# Patient Record
Sex: Female | Born: 1995 | Hispanic: No | Marital: Single | Smoking: Never smoker
Health system: Southern US, Community
[De-identification: ages and names within clinical notes are randomized; demographics above are authoritative.]

## PROBLEM LIST (undated history)

## (undated) DIAGNOSIS — N390 Urinary tract infection, site not specified: Secondary | ICD-10-CM

## (undated) DIAGNOSIS — R002 Palpitations: Secondary | ICD-10-CM

## (undated) DIAGNOSIS — G43109 Migraine with aura, not intractable, without status migrainosus: Secondary | ICD-10-CM

## (undated) HISTORY — DX: Urinary tract infection, site not specified: N39.0

## (undated) HISTORY — DX: Palpitations: R00.2

## (undated) HISTORY — DX: Migraine with aura, not intractable, without status migrainosus: G43.109

---
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
## 2016-04-15 ENCOUNTER — Encounter: Payer: Self-pay | Admitting: Pediatrics

## 2016-04-16 ENCOUNTER — Encounter: Payer: Self-pay | Admitting: Pediatrics

## 2018-06-02 ENCOUNTER — Other Ambulatory Visit (HOSPITAL_COMMUNITY): Payer: Self-pay

## 2018-06-02 DIAGNOSIS — Z3687 Encounter for antenatal screening for uncertain dates: Secondary | ICD-10-CM

## 2018-06-07 ENCOUNTER — Other Ambulatory Visit (HOSPITAL_COMMUNITY): Payer: Self-pay

## 2018-06-07 ENCOUNTER — Ambulatory Visit (HOSPITAL_COMMUNITY)
Admission: RE | Admit: 2018-06-07 | Discharge: 2018-06-07 | Disposition: A | Discharge status: Home / Self Care | Payer: Medicaid Other | Source: Ambulatory Visit

## 2018-06-07 DIAGNOSIS — Z3A12 12 weeks gestation of pregnancy: Secondary | ICD-10-CM | POA: Diagnosis not present

## 2018-06-07 DIAGNOSIS — Z3687 Encounter for antenatal screening for uncertain dates: Secondary | ICD-10-CM

## 2018-06-28 ENCOUNTER — Other Ambulatory Visit (HOSPITAL_COMMUNITY): Payer: Self-pay

## 2018-06-28 DIAGNOSIS — Z3689 Encounter for other specified antenatal screening: Secondary | ICD-10-CM

## 2018-06-28 DIAGNOSIS — Z3A19 19 weeks gestation of pregnancy: Secondary | ICD-10-CM

## 2018-06-28 DIAGNOSIS — O2612 Low weight gain in pregnancy, second trimester: Secondary | ICD-10-CM

## 2018-06-28 DIAGNOSIS — Z3402 Encounter for supervision of normal first pregnancy, second trimester: Secondary | ICD-10-CM

## 2018-07-26 ENCOUNTER — Encounter (HOSPITAL_COMMUNITY): Payer: Self-pay

## 2018-08-02 ENCOUNTER — Encounter (HOSPITAL_COMMUNITY): Payer: Self-pay | Admitting: *Deleted

## 2018-08-02 ENCOUNTER — Ambulatory Visit (HOSPITAL_COMMUNITY)
Admission: RE | Admit: 2018-08-02 | Discharge: 2018-08-02 | Disposition: A | Discharge status: Home / Self Care | Payer: Medicaid Other | Source: Ambulatory Visit

## 2018-08-02 DIAGNOSIS — Z3A20 20 weeks gestation of pregnancy: Secondary | ICD-10-CM | POA: Diagnosis not present

## 2018-08-02 DIAGNOSIS — Z3689 Encounter for other specified antenatal screening: Secondary | ICD-10-CM

## 2018-08-02 DIAGNOSIS — Z3402 Encounter for supervision of normal first pregnancy, second trimester: Secondary | ICD-10-CM

## 2018-08-02 DIAGNOSIS — O2612 Low weight gain in pregnancy, second trimester: Secondary | ICD-10-CM

## 2018-08-02 DIAGNOSIS — Z3A19 19 weeks gestation of pregnancy: Secondary | ICD-10-CM

## 2018-08-02 DIAGNOSIS — Z363 Encounter for antenatal screening for malformations: Secondary | ICD-10-CM | POA: Diagnosis present

## 2018-08-03 ENCOUNTER — Other Ambulatory Visit (HOSPITAL_COMMUNITY): Payer: Self-pay | Admitting: *Deleted

## 2018-08-03 DIAGNOSIS — Z362 Encounter for other antenatal screening follow-up: Secondary | ICD-10-CM

## 2018-08-31 ENCOUNTER — Ambulatory Visit (HOSPITAL_COMMUNITY)
Admission: RE | Admit: 2018-08-31 | Discharge: 2018-08-31 | Disposition: A | Discharge status: Home / Self Care | Payer: Medicaid Other | Source: Ambulatory Visit

## 2018-08-31 DIAGNOSIS — Z362 Encounter for other antenatal screening follow-up: Secondary | ICD-10-CM | POA: Diagnosis present

## 2018-08-31 DIAGNOSIS — Z3A24 24 weeks gestation of pregnancy: Secondary | ICD-10-CM | POA: Diagnosis not present

## 2018-12-20 ENCOUNTER — Other Ambulatory Visit (HOSPITAL_COMMUNITY): Payer: Self-pay

## 2018-12-20 DIAGNOSIS — Z3A41 41 weeks gestation of pregnancy: Principal | ICD-10-CM

## 2018-12-20 DIAGNOSIS — O480 Post-term pregnancy: Secondary | ICD-10-CM

## 2018-12-26 ENCOUNTER — Encounter (HOSPITAL_COMMUNITY): Payer: Self-pay

## 2018-12-26 ENCOUNTER — Ambulatory Visit (HOSPITAL_COMMUNITY): Admission: RE | Admit: 2018-12-26 | Payer: Medicaid Other | Source: Ambulatory Visit

## 2019-03-14 ENCOUNTER — Encounter (HOSPITAL_COMMUNITY): Payer: Self-pay

## 2019-12-13 IMAGING — US US MFM OB FOLLOW-UP
1 series · 14 of 28 positions shown · non-contrast
Comparison: none

[Series 1: us mfm ob follow-up · 45 acquisitions, 14 frames shown]
[im 2/45]
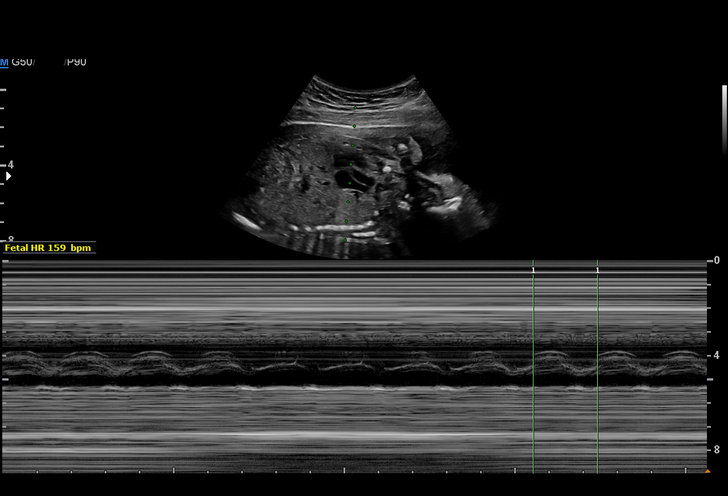
[im 5/45]
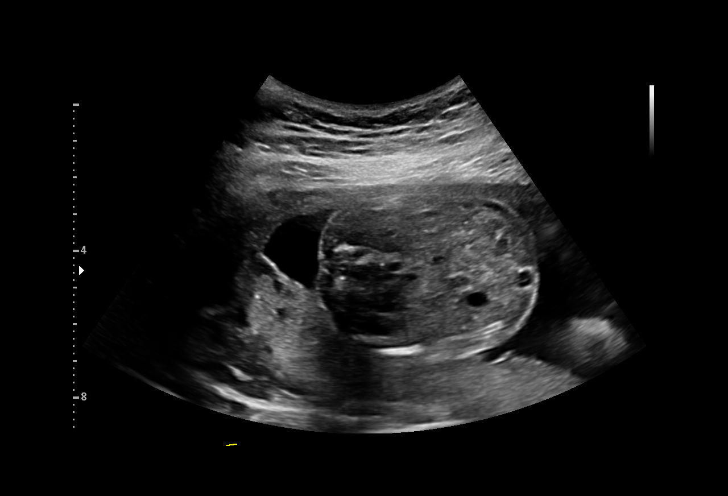
[im 9/45]
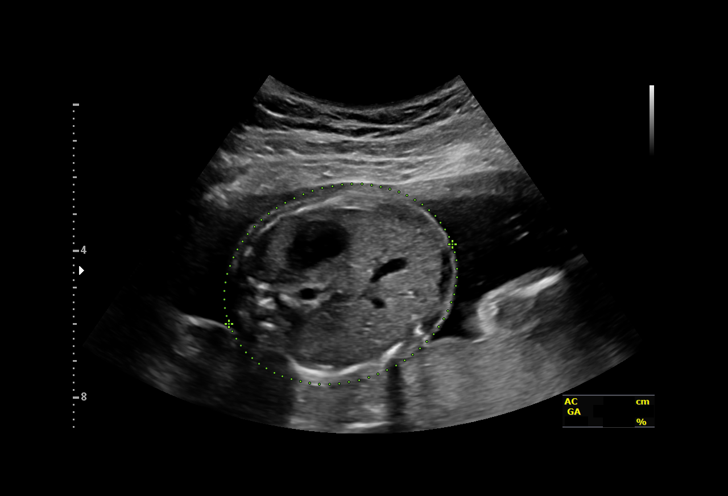
[im 12/45]
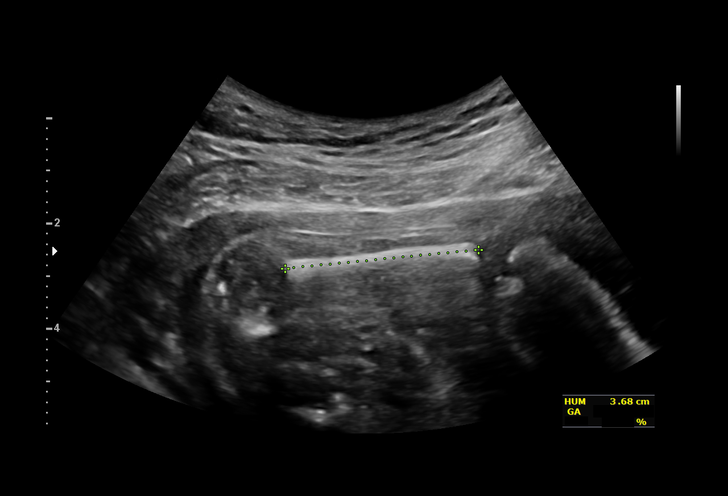
[im 15/45]
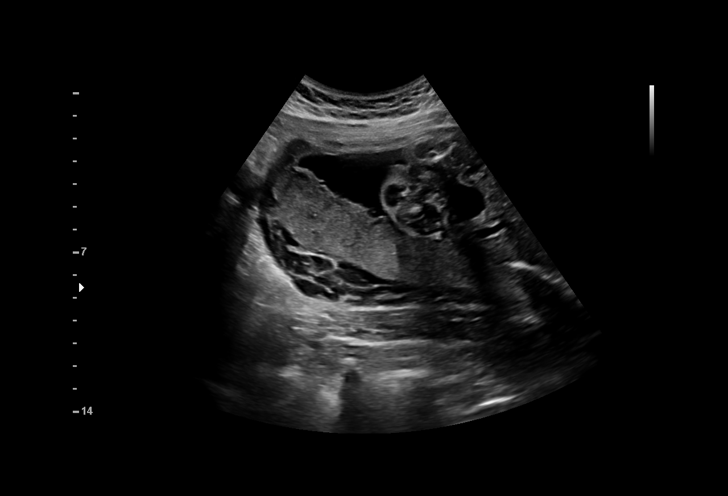
[im 18/45]
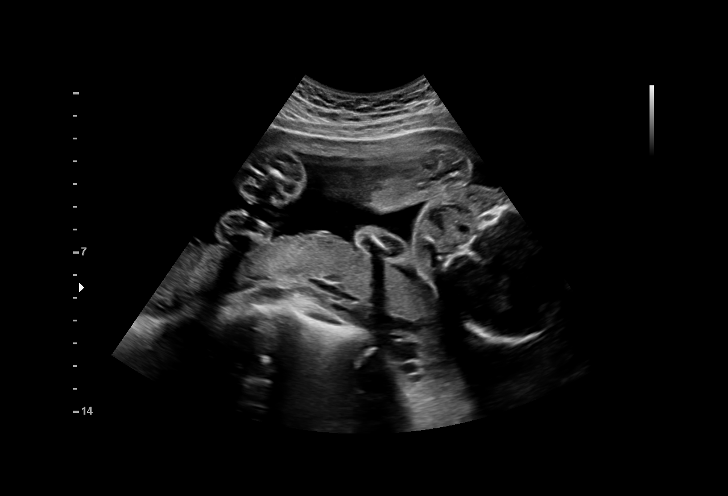
[im 22/45]
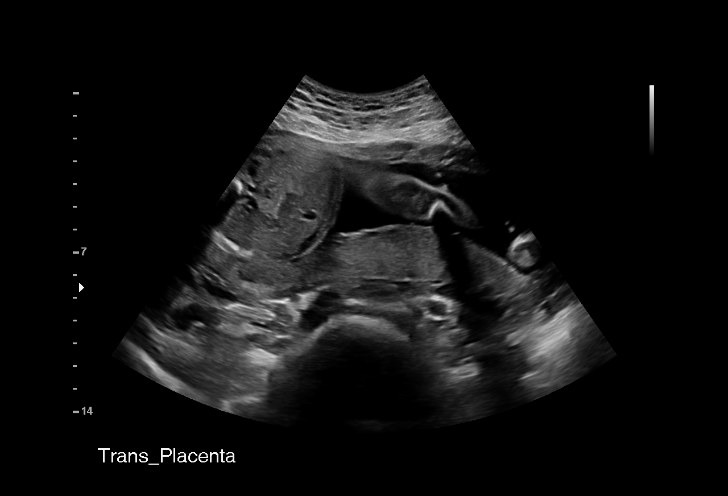
[im 25/45]
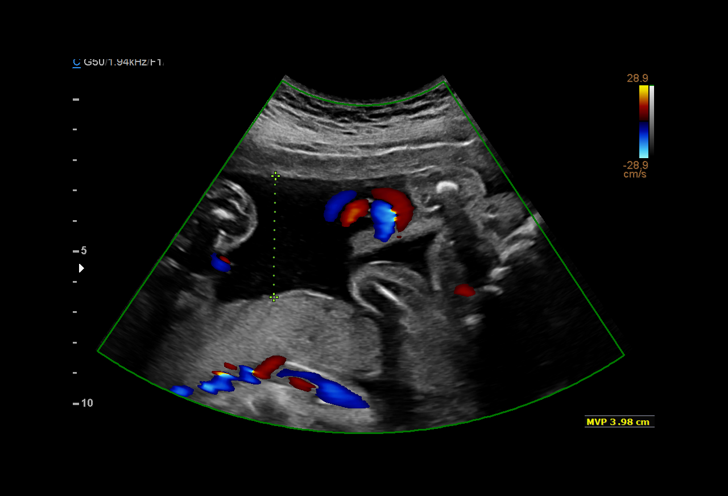
[im 28/45]
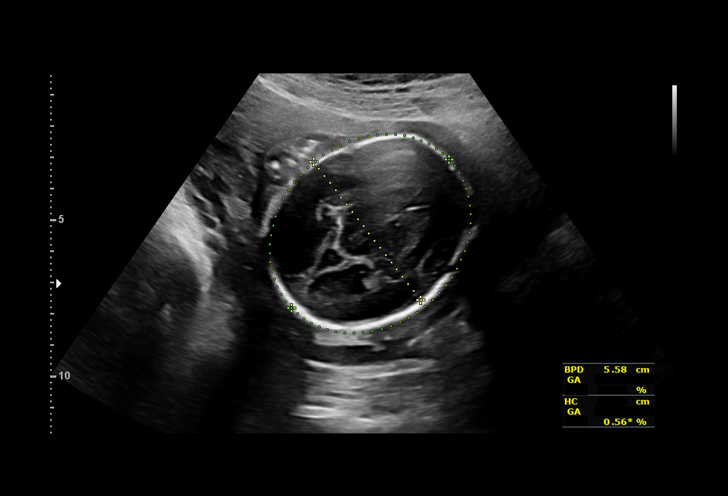
[im 31/45]
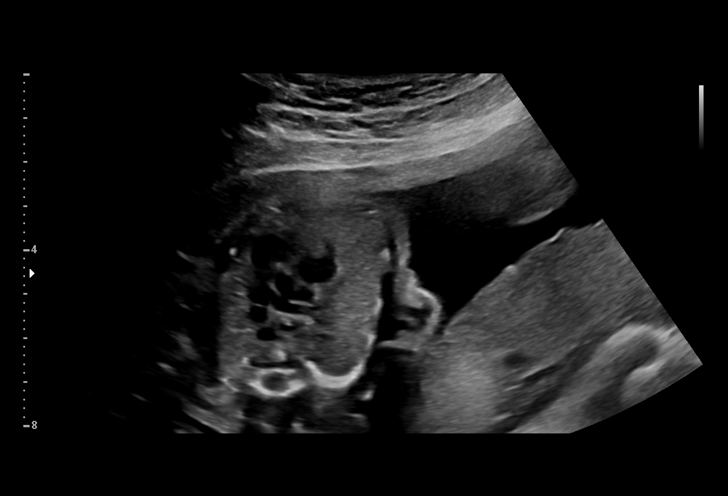
[im 35/45]
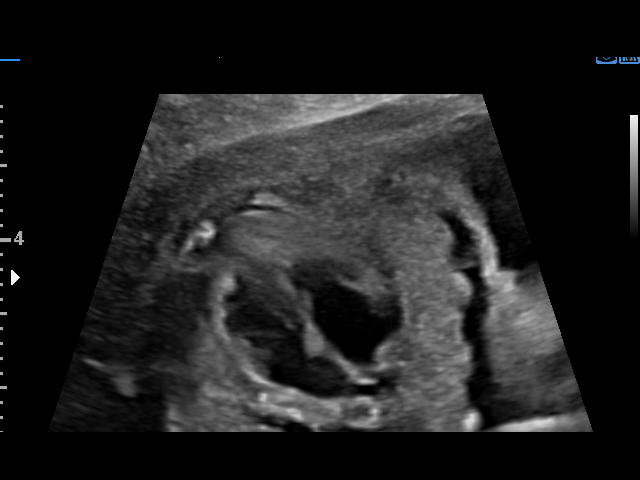
[im 38/45]
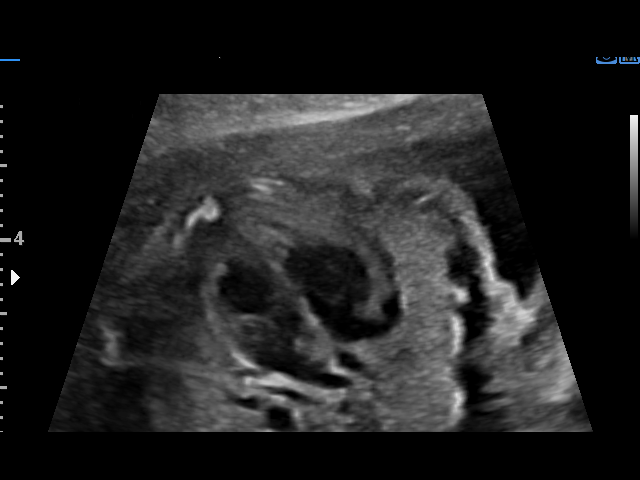
[im 41/45]
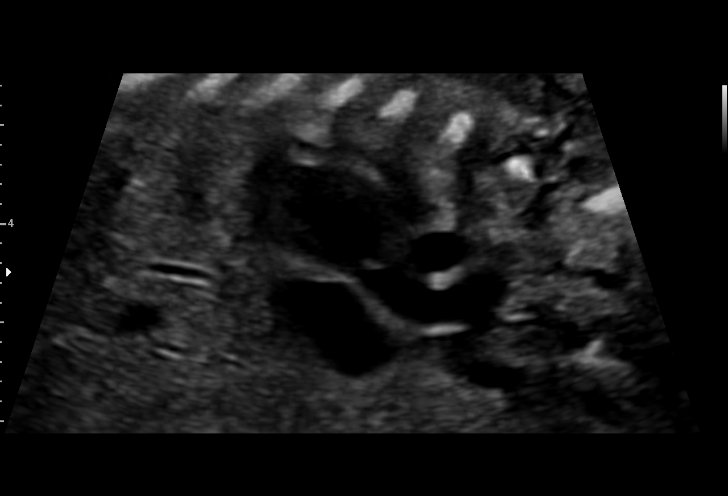
[im 45/45]
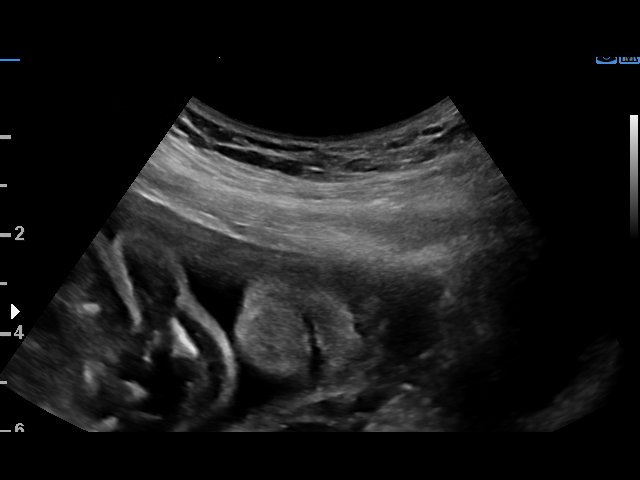

[14 of 28 positions shown; findings below may reference images not displayed]

CNM

                                                       MENTI
 ----------------------------------------------------------------------

 ----------------------------------------------------------------------
Indications

  Encounter for other antenatal screening
  follow-up
  24 weeks gestation of pregnancy
 ----------------------------------------------------------------------
Vital Signs

 Weight (lb): 161
Fetal Evaluation

 Num Of Fetuses:         1
 Fetal Heart Rate(bpm):  159
 Cardiac Activity:       Observed
 Presentation:           Cephalic
 Placenta:               Posterior
 P. Cord Insertion:      Previously Visualized

 Amniotic Fluid
 AFI FV:      Within normal limits

                             Largest Pocket(cm)

Biometry

 BPD:      55.8  mm     G. Age:  23w 0d         10  %    CI:        76.72   %    70 - 86
                                                         FL/HC:      20.1   %    18.7 -
 HC:      201.8  mm     G. Age:  22w 2d        < 3  %    HC/AC:      1.08        1.05 -
 AC:      186.1  mm     G. Age:  23w 3d         21  %    FL/BPD:     72.6   %    71 - 87
 FL:       40.5  mm     G. Age:  23w 1d         11  %    FL/AC:      21.8   %    20 - 24
 HUM:      36.5  mm     G. Age:  22w 5d          7  %
 LV:        6.9  mm
 Est. FW:     569  gm      1 lb 4 oz     31  %
OB History

 Gravidity:    1         Term:   0        Prem:   0        SAB:   0
 TOP:          0       Ectopic:  0        Living: 0
Gestational Age

 U/S Today:     23w 0d                                        EDD:   12/28/18
 Best:          24w 1d     Det. By:  Early Ultrasound         EDD:   12/20/18
                                     (06/07/18)
Anatomy

 Cranium:               Appears normal         Aortic Arch:            Previously seen
 Cavum:                 Previously seen        Ductal Arch:            Previously seen
 Ventricles:            Appears normal         Diaphragm:              Appears normal
 Choroid Plexus:        Previously seen        Stomach:                Appears normal, left
                                                                       sided
 Cerebellum:            Previously seen        Abdomen:                Appears normal
 Posterior Fossa:       Previously seen        Abdominal Wall:         Previously seen
 Nuchal Fold:           Previously seen        Cord Vessels:           Previously seen
 Face:                  Orbits and profile     Kidneys:                Appear normal
                        previously seen
 Lips:                  Appears normal         Bladder:                Appears normal
 Thoracic:              Appears normal         Spine:                  Previously seen
 Heart:                 Appears normal         Upper Extremities:      Previously seen
                        (4CH, axis, and situs
 RVOT:                  Appears normal         Lower Extremities:      Previously seen
 LVOT:                  Appears normal

 Other:  Heels and 5th digit prev visualized. Nasal bone prev visualized.
         Female gender previously seen.
Cervix Uterus Adnexa

 Cervix
 Length:           3.15  cm.
 Normal appearance by transabdominal scan.

 Uterus
 No abnormality visualized.

 Left Ovary
 Previously seen.

 Right Ovary
 Previously seen
Impression

 Live appropriately grown late 2nd trimester singleton
 gestation with normal amniotic fluid volume.
Recommendations

 Follow up as clinically indicated and or scheduled.
                Fassi, Crombez

## 2020-09-18 ENCOUNTER — Inpatient Hospital Stay (HOSPITAL_COMMUNITY)
Admission: EM | Admit: 2020-09-18 | Discharge: 2020-09-18 | Disposition: A | Discharge status: Home / Self Care | Payer: Medicaid Other | Attending: Family Medicine | Admitting: Family Medicine

## 2020-09-18 ENCOUNTER — Encounter (HOSPITAL_COMMUNITY): Payer: Self-pay | Admitting: *Deleted

## 2020-09-18 DIAGNOSIS — O26891 Other specified pregnancy related conditions, first trimester: Secondary | ICD-10-CM | POA: Insufficient documentation

## 2020-09-18 DIAGNOSIS — G43109 Migraine with aura, not intractable, without status migrainosus: Secondary | ICD-10-CM | POA: Diagnosis not present

## 2020-09-18 DIAGNOSIS — Z3A01 Less than 8 weeks gestation of pregnancy: Secondary | ICD-10-CM | POA: Diagnosis not present

## 2020-09-18 LAB — URINALYSIS, ROUTINE W REFLEX MICROSCOPIC
Bilirubin Urine: NEGATIVE
Glucose, UA: NEGATIVE mg/dL
Hgb urine dipstick: NEGATIVE
Ketones, ur: 5 mg/dL — AB
Leukocytes,Ua: NEGATIVE
Nitrite: NEGATIVE
Protein, ur: NEGATIVE mg/dL
Specific Gravity, Urine: 1.02 (ref 1.005–1.030)
pH: 5 (ref 5.0–8.0)

## 2020-09-18 LAB — CBC
HCT: 40.3 % (ref 36.0–46.0)
Hemoglobin: 13.7 g/dL (ref 12.0–15.0)
MCH: 31.5 pg (ref 26.0–34.0)
MCHC: 34 g/dL (ref 30.0–36.0)
MCV: 92.6 fL (ref 80.0–100.0)
Platelets: 241 10*3/uL (ref 150–400)
RBC: 4.35 MIL/uL (ref 3.87–5.11)
RDW: 12.3 % (ref 11.5–15.5)
WBC: 11.6 10*3/uL — ABNORMAL HIGH (ref 4.0–10.5)
nRBC: 0 % (ref 0.0–0.2)

## 2020-09-18 LAB — BASIC METABOLIC PANEL
Anion gap: 6 (ref 5–15)
BUN: 6 mg/dL (ref 6–20)
CO2: 23 mmol/L (ref 22–32)
Calcium: 8.7 mg/dL — ABNORMAL LOW (ref 8.9–10.3)
Chloride: 107 mmol/L (ref 98–111)
Creatinine, Ser: 0.53 mg/dL (ref 0.44–1.00)
GFR, Estimated: >60 mL/min (ref >60)
Glucose, Bld: 95 mg/dL (ref 70–99)
Potassium: 3.9 mmol/L (ref 3.5–5.1)
Sodium: 136 mmol/L (ref 135–145)

## 2020-09-18 LAB — HCG, QUANTITATIVE, PREGNANCY: hCG, Beta Chain, Quant, S: 97784 m[IU]/mL — ABNORMAL HIGH (ref <5)

## 2020-09-18 LAB — POC URINE PREG, ED: Preg Test, Ur: POSITIVE — AB

## 2020-09-18 NOTE — MAU Note (Signed)
Sent up from ER. With first preg,  "would lose her vision", parts of her body would go numb, would get like "aura's in her vision"  Continued off and on for a year.  No testing related to this.  Today it started again,  Had a +HPT, confirmed in the ED. Has a slight HA, has not taken anything (3 days).  Had high BP through out first preg.

## 2020-09-18 NOTE — ED Triage Notes (Signed)
Pt reports LMP end of October. Pt reports positive home pregnancy test. Denies prenatal care. Pt reports new onset of blurred vision and HTN but hasn't taken her blood pressure. Pt reports hx of elevated blood pressure with previous pregnancy. VSS at present.

## 2020-09-18 NOTE — MAU Provider Note (Signed)
Chief Complaint: Hypertension, Headache, and Possible Pregnancy   None     SUBJECTIVE HPI: Abigail Morrow is a 24 y.o. G2P1001 at [redacted]w[redacted]d who presents to maternity admissions reporting intermittent headache, vision changes, numbness of right hand.  She is not having a headache while in MAU.  Pt reports she had similar symptoms during her last pregnancy and the symptoms started again with this pregnancy. She will lose part of her visual field and have numbness in one arm/hand, which resolves and is followed by a headache that feels like pressure in the front and top of her head.  There are no other associated symptoms. She denies any abdominal pain or vaginal bleeding. She is sure of her LMP.     HPI  No past medical history on file. No past surgical history on file. Social History   Socioeconomic History  . Marital status: Single    Spouse name: Not on file  . Number of children: Not on file  . Years of education: Not on file  . Highest education level: Not on file  Occupational History  . Not on file  Tobacco Use  . Smoking status: Never Smoker  . Smokeless tobacco: Not on file  Substance and Sexual Activity  . Alcohol use: No  . Drug use: Not on file  . Sexual activity: Not on file  Other Topics Concern  . Not on file  Social History Narrative  . Not on file   Social Determinants of Health   Financial Resource Strain: Not on file  Food Insecurity: Not on file  Transportation Needs: Not on file  Physical Activity: Not on file  Stress: Not on file  Social Connections: Not on file  Intimate Partner Violence: Not on file   No current facility-administered medications on file prior to encounter.   Current Outpatient Medications on File Prior to Encounter  Medication Sig Dispense Refill  . amoxicillin (AMOXIL) 500 MG capsule Take 1 capsule (500 mg total) by mouth 2 (two) times daily. 14 capsule 0  . ibuprofen (ADVIL,MOTRIN) 600 MG tablet Take 1 tablet (600 mg total) by  mouth every 6 (six) hours as needed for pain. 30 tablet 0  . norgestimate-ethinyl estradiol (ORTHO-CYCLEN) 0.25-35 MG-MCG tablet Take 1 tablet by mouth daily.     No Known Allergies  ROS:  Review of Systems  Constitutional: Negative for chills, fatigue and fever.  Respiratory: Negative for shortness of breath.   Cardiovascular: Negative for chest pain.  Gastrointestinal: Negative for abdominal pain.  Genitourinary: Negative for difficulty urinating, dysuria, flank pain, pelvic pain, vaginal bleeding, vaginal discharge and vaginal pain.  Neurological: Positive for headaches. Negative for dizziness.  Psychiatric/Behavioral: Negative.      I have reviewed patient's Past Medical Hx, Surgical Hx, Family Hx, Social Hx, medications and allergies.   Physical Exam   Patient Vitals for the past 24 hrs:  BP Temp Temp src Pulse Resp SpO2 Height Weight  09/18/20 1832 (!) 112/57 98.5 F (36.9 C) Oral 69 16 99 % -- --  09/18/20 1527 120/69 98.4 F (36.9 C) Oral 74 18 100 % 5\' 9"  (1.753 m) 68.8 kg  09/18/20 1341 118/72 -- -- 70 16 100 % -- --  09/18/20 1150 123/73 98.5 F (36.9 C) Oral 76 20 100 % -- --   Constitutional: Well-developed, well-nourished female in no acute distress.  Cardiovascular: normal rate Respiratory: normal effort GI: Abd soft, non-tender. Pos BS x 4 MS: Extremities nontender, no edema, normal ROM Neurological - ,  PERRLA, alert, oriented, normal speech, no focal findings or movement disorder noted, screening mental status exam normal, cranial nerves II through XII intact, DTR's normal and symmetric, motor and sensory grossly normal bilaterally, normal muscle tone, no tremors, strength 5/5  GU: Neg CVAT.   LAB RESULTS Results for orders placed or performed during the hospital encounter of 09/18/20 (from the past 24 hour(s))  Basic metabolic panel     Status: Abnormal   Collection Time: 09/18/20 11:54 AM  Result Value Ref Range   Sodium 136 135 - 145 mmol/L    Potassium 3.9 3.5 - 5.1 mmol/L   Chloride 107 98 - 111 mmol/L   CO2 23 22 - 32 mmol/L   Glucose, Bld 95 70 - 99 mg/dL   BUN 6 6 - 20 mg/dL   Creatinine, Ser 6.21 0.44 - 1.00 mg/dL   Calcium 8.7 (L) 8.9 - 10.3 mg/dL   GFR, Estimated >30 >86 mL/min   Anion gap 6 5 - 15  POC Urine Pregnancy, ED (not at [facility])     Status: Abnormal   Collection Time: 09/18/20  1:28 PM  Result Value Ref Range   Preg Test, Ur POSITIVE (A) NEGATIVE  hCG, quantitative, pregnancy     Status: Abnormal   Collection Time: 09/18/20  3:17 PM  Result Value Ref Range   hCG, Beta Chain, Quant, S 97,784 (H) <5 mIU/mL  CBC     Status: Abnormal   Collection Time: 09/18/20  3:17 PM  Result Value Ref Range   WBC 11.6 (H) 4.0 - 10.5 K/uL   RBC 4.35 3.87 - 5.11 MIL/uL   Hemoglobin 13.7 12.0 - 15.0 g/dL   HCT 57.8 46.9 - 62.9 %   MCV 92.6 80.0 - 100.0 fL   MCH 31.5 26.0 - 34.0 pg   MCHC 34.0 30.0 - 36.0 g/dL   RDW 52.8 41.3 - 24.4 %   Platelets 241 150 - 400 K/uL   nRBC 0.0 0.0 - 0.2 %  Urinalysis, Routine w reflex microscopic Urine, Clean Catch     Status: Abnormal   Collection Time: 09/18/20  4:33 PM  Result Value Ref Range   Color, Urine YELLOW YELLOW   APPearance CLEAR CLEAR   Specific Gravity, Urine 1.020 1.005 - 1.030   pH 5.0 5.0 - 8.0   Glucose, UA NEGATIVE NEGATIVE mg/dL   Hgb urine dipstick NEGATIVE NEGATIVE   Bilirubin Urine NEGATIVE NEGATIVE   Ketones, ur 5 (A) NEGATIVE mg/dL   Protein, ur NEGATIVE NEGATIVE mg/dL   Nitrite NEGATIVE NEGATIVE   Leukocytes,Ua NEGATIVE NEGATIVE       IMAGING No results found.  MAU Management/MDM: Orders Placed This Encounter  Procedures  . hCG, quantitative, pregnancy  . Basic metabolic panel  . Urinalysis, Routine w reflex microscopic Urine, Clean Catch  . CBC  . POC Urine Pregnancy, ED (not at [facility])  . Discharge patient    No orders of the defined types were placed in this encounter.   Neuro exam wnl.  No acute findings.  No pain or bleeding.  Pt with sure  LMP so pregnancy dated by LMP at this time.  Pt to f/u with prenatal care but also to contact PCP for evaluation of likely migraines.  May need neurology referral if persistent.  Warning signs/reasons to seek care reviewed.  Pt given contact information for prenatal providers.  ASSESSMENT 1. Migraine with aura and without status migrainosus, not intractable     PLAN Discharge home Allergies as of 09/18/2020  No Known Allergies     Medication List    STOP taking these medications   amoxicillin 500 MG capsule Commonly known as: AMOXIL   ibuprofen 600 MG tablet Commonly known as: ADVIL   norgestimate-ethinyl estradiol 0.25-35 MG-MCG tablet Commonly known as: ORTHO-CYCLEN       Follow-up Information    Prenatal provider of your choice Follow up.   Why: See list provided       Primary care provider Follow up.               Sharen Counter Certified Nurse-Midwife 09/18/2020  7:32 PM

## 2020-09-18 NOTE — ED Triage Notes (Addendum)
Emergency Medicine Provider OB Triage Evaluation Note  Abigail Morrow is a 24 y.o. female, G1P0, at Unknown gestation who presents to the emergency department with complaints of visual aura  Review of  Systems  Positive: visual cahnges Negative: no abdominal pain  Physical Exam  BP 118/72 (BP Location: Right Arm)   Pulse 70   Temp 98.5 F (36.9 C) (Oral)   Resp 16   SpO2 100%  General: Awake, no distress  HEENT: Atraumatic  Resp: Normal effort  Cardiac: Normal rate  MSK: Moves all extremities without difficulty Neuro: Speech clear  Medical Decision Making  Pt evaluated for pregnancy concern and is stable for transfer to MAU. Pt is in agreement with plan for transfer.  2:14 PM Discussed with MAU APP Raelyn Mora  who accepts patient in transfer.  Clinical Impression  No diagnosis found.     Elson Areas, PA-C 09/18/20 1415    Elson Areas, [place] 09/18/20 1416

## 2020-09-18 NOTE — Discharge Instructions (Signed)
For more information about midwives, doulas, and homebirth in [place]:  [email or web address]  Prenatal Care Providers           [facility] @ [facility]  (midwives are at [facility] of the [facility] [employer])  [address] [phone]  Center for [facility] @ [facility]   [address]  [phone]  [facility] @ [facility]       [address] [phone]            [facility] for [facility] @ [place]     [phone]          [facility] @ [facility]   [address] [phone]  [facility] @ [facility]  [address] [phone]     [facility] for [facility] @ [address] [place])  [address]   [phone]     [facility]  Phone: [phone]  [facility] (midwives) Phone: [phone]  [facility] Phone: [phone]  [facility] Phone: [phone]  [facility] Phone: [phone]  [facility] Phone: [phone]  [facility] (midwives) Phone: [phone]

## 2020-09-21 NOTE — L&D Delivery Note (Signed)
Delivery Note Got into tub with advancing labor.  Noted to be completely dilated with bulging membranes.  Pushed well and SROM clear fluid occurred.  Delivered in Hands and Knees, shoulders tight, so patient repositioned for completion of posterior shoulder delivery.    At 4:11 AM a viable and healthy female was delivered via Vaginal, Spontaneous (Presentation:   Occiput Anterior).  APGAR: 7, 9; weight  .   Placenta status: Spontaneous, Intact.  Cord: 3 vessels with the following complications: None.    Anesthesia: None Episiotomy: None Lacerations: None Suture Repair:  none Est. Blood Loss (mL): 50  Mom to postpartum.  Baby to Couplet care / Skin to Skin.  Wynelle Bourgeois 04/16/2021, 4:53 AM

## 2020-10-31 ENCOUNTER — Other Ambulatory Visit: Payer: Self-pay

## 2020-10-31 ENCOUNTER — Telehealth (INDEPENDENT_AMBULATORY_CARE_PROVIDER_SITE_OTHER): Payer: Medicaid Other | Admitting: *Deleted

## 2020-10-31 DIAGNOSIS — O0990 Supervision of high risk pregnancy, unspecified, unspecified trimester: Secondary | ICD-10-CM

## 2020-10-31 DIAGNOSIS — O219 Vomiting of pregnancy, unspecified: Secondary | ICD-10-CM

## 2020-10-31 MED ORDER — PROMETHAZINE HCL 25 MG PO TABS: 25.0000 mg | ORAL_TABLET | Freq: Four times a day (QID) | ORAL | 0 refills | Status: DC | PRN

## 2020-10-31 NOTE — Addendum Note (Signed)
Addended byGerome Apley on: 10/31/2020 02:10 PM   Modules accepted: Orders

## 2020-10-31 NOTE — Progress Notes (Addendum)
New OB Intake   0830: Patient not on virtual visit. Called patient and instructed how to join virtual visit.   I connected with  Abigail Morrow on 10/31/20 at 8:40 by MyChart and verified that I am speaking with the correct person using two identifiers. Nurse is located at [facility] and pt is located at home.  I discussed the limitations, risks, security and privacy concerns of performing an evaluation and management service by telephone and the availability of in person appointments. I also discussed with the patient that there may be a patient responsible charge related to this service. The patient expressed understanding and agreed to proceed.  I explained I am completing New OB Intake today. We discussed her EDD of 04/16/2021 that is based on LMP of 07/10/20. This was changed due to patient reporting she had told MAU wrong LMP because she had headache and had been waiting a long time and just told MAU wrong. Pt is G2/P1001. I reviewed her allergies, medications, Medical/Surgical/OB history, and appropriate screenings. I informed her of [facility] services. Based on history, this is a/an uncomplicated pregnancy.  Concerns addressed today  Nausea and vomiting: Phenergan RX sent in per protocol.   Delivery Plans:  Plans to deliver at [facility].   MyChart/Babyscripts MyChart access verified. I explained pt will have some visits in office and some virtually. Babyscripts instructions given.   Blood Pressure Cuff Patient states she has a cuff she can get from her Mother. I asked her to let us know if she cannot get that cuff and we can order cuff or give her one.  Explained after first prenatal appt pt will check weekly and document in Babyscripts.  Anatomy US Explained first scheduled [place] will be around 19 weeks and she will be notified by MyChart.    Labs Discussed Avelina Laine genetic screening with patient. Is undecided about  [facility] and [facility] .  Routine prenatal labs needed.  Covid  Vaccine Patient has not covid vaccine.   [facility] Referral Patient is interested in referral to [facility].    First visit review I reviewed new OB appt with pt. I explained she will have a pelvic exam, ob bloodwork with genetic screening if desired, and PAP smear if needed. Explained pt will be seen by Dr. Crissie Reese at first visit; encounter routed to appropriate provider. If new patient offered monthly Zoom meeting   Azrael Maddix,RN 10/31/2020  8:40 AM

## 2020-10-31 NOTE — Patient Instructions (Addendum)
- At our [facility], we work as an integrated team, providing care to address both physical and emotional health. Your medical provider may refer you to see our Behavioral Health Clinician (BHC) on the same day you see your medical provider, as availability permits; often scheduled virtually at your convenience.  Our BHC is available to all patients, visits generally last between 20-30 minutes, but can be longer or shorter, depending on patient need. The BHC offers help with stress management, coping with symptoms of depression and anxiety, major life changes , sleep issues, changing risky behavior, grief and loss, life stress, working on personal life goals, and  behavioral health issues, as these all affect your overall health and wellness.  The BHC is NOT available for the following: FMLA paperwork, court-ordered evaluations, specialty assessments (custody or disability), letters to employers, or obtaining certification for an emotional support animal. The BHC does not provide long-term therapy. You have the right to refuse integrated behavioral health services, or to reschedule to see the BHC at a later date.  Confidentiality exception: If it is suspected that a child or disabled adult is being abused or neglected, we are required by law to report that to either Child Protective Services or Adult Protective Services.  If you have a diagnosis of Bipolar affective disorder, Schizophrenia, or recurrent Major depressive disorder, we will recommend that you establish care with a psychiatrist, as these are lifelong, chronic conditions, and we want your overall emotional health and medications to be more closely monitored. If you anticipate needing extended maternity leave due to mental health issues postpartum, it it recommended you inform your medical provider, so we can put in a referral to a  psychiatrist as soon as possible. The BHC is unable to recommend an extended maternity leave for mental  health issues. Your medical provider or BHC may refer you to a therapist for ongoing, traditional therapy, or to a psychiatrist, for medication management, if it would benefit your overall health. Depending on your insurance, you may have a copay to see the BHC. If you are uninsured, it is recommended that you apply for financial assistance. (Forms may be requested at the front desk for in-person visits, via MyChart, or request a form during a virtual visit).  If you see the BHC more than 6 times, you will have to complete a comprehensive clinical assessment interview with the BHC to resume integrated services.  For virtual visits with the BHC, you must be physically in the state of [place] at the time of the visit. For example, if you live in [place], you will have to do an in-person visit with the BHC, and your out-of-state insurance may not cover behavioral health services in [place]. f you are going out of the state or country for any reason, the BHC may see you virtually when you return to [place], but not while you are physically outside of [place].     Meet the Provider Zoom Sessions      [facility] is now offering FREE monthly 1-hour virtual Zoom sessions for new, current, and prospective patients.        During these sessions, you can:   Learn about our practice, model of care, services   Get answers to questions about pregnancy and birth during COVID   Pick your provider's brain about anything else!    Sessions will be hosted by [facility] Nurse Practitioners, Physician Assistants, Physicians and Midwives            No registration required      2021 Dates:      All at 6pm     October 21st     November 18th   December 16th     January 20th  February 17th    To join one of these meetings, a few minutes before it is set to start:     Copy/paste the link into your web  browser:  [email or web address]    OR  Scan the QR code below (open up your camera and point towards QR code; click on tab that pops up on your phone ("zoom")    

## 2020-11-06 ENCOUNTER — Ambulatory Visit (INDEPENDENT_AMBULATORY_CARE_PROVIDER_SITE_OTHER): Payer: Medicaid Other | Admitting: Family Medicine

## 2020-11-06 ENCOUNTER — Encounter: Payer: Self-pay | Admitting: Family Medicine

## 2020-11-06 ENCOUNTER — Other Ambulatory Visit: Payer: Self-pay

## 2020-11-06 DIAGNOSIS — O0990 Supervision of high risk pregnancy, unspecified, unspecified trimester: Secondary | ICD-10-CM

## 2020-11-06 NOTE — Progress Notes (Signed)
Subjective:   Abigail Morrow is a 25 y.o. G2P1001 at [redacted]w[redacted]d by LMP (regular prior to conceiving and not on birth control) being seen today for her first obstetrical visit.  Her obstetrical history is significant for normal. Patient does intend to breast feed. Pregnancy history fully reviewed.  Patient reports no complaints.  HISTORY: OB History  Gravida Para Term Preterm AB Living  2 1 1  0 0 1  SAB IAB Ectopic Multiple Live Births  0 0 0 0 1    # Outcome Date GA Lbr Len/2nd Weight Sex Delivery Anes PTL Lv  2 Current           1 Term 12/25/18 [redacted]w[redacted]d  7 lb (3.175 kg) F Vag-Spont None  LIV     Birth Comments: wnl   Last pap smear was  06/01/2018 and was normal Past Medical History:  Diagnosis Date  . Migraine headache with aura   . Palpitations   . UTI (urinary tract infection)    Past Surgical History:  Procedure Laterality Date  . WISDOM TOOTH EXTRACTION  2015   History reviewed. No pertinent family history. Social History   Tobacco Use  . Smoking status: Never Smoker  . Smokeless tobacco: Never Used  Vaping Use  . Vaping Use: Never used  Substance Use Topics  . Alcohol use: Not Currently    Comment: occasionally  . Drug use: Never   No Known Allergies Current Outpatient Medications on File Prior to Visit  Medication Sig Dispense Refill  . Prenatal Vit-Fe Fumarate-FA (PRENATAL VITAMINS PO) Take 1 tablet by mouth daily.    . promethazine (PHENERGAN) 25 MG tablet Take 1 tablet (25 mg total) by mouth every 6 (six) hours as needed for nausea or vomiting. (Patient not taking: Reported on 11/06/2020) 30 tablet 0   No current facility-administered medications on file prior to visit.     Exam   Vitals:   11/06/20 0929  BP: 113/61  Pulse: 71  Weight: 155 lb (70.3 kg)   Fetal Heart Rate (bpm): 135  Uterus:     Pelvic Exam: Perineum: no hemorrhoids, normal perineum   Vulva: normal external genitalia, no lesions   Vagina:  normal mucosa, normal discharge    Cervix: no lesions and normal, pap smear done.    Adnexa: normal adnexa and no mass, fullness, tenderness   Bony Pelvis: average  System: General: well-developed, well-nourished female in no acute distress   Breast:  normal appearance, no masses or tenderness   Skin: normal coloration and turgor, no rashes   Neurologic: oriented, normal, negative, normal mood   Extremities: normal strength, tone, and muscle mass, ROM of all joints is normal   HEENT PERRLA, extraocular movement intact and sclera clear, anicteric   Mouth/Teeth mucous membranes moist, pharynx normal without lesions and dental hygiene good   Neck supple and no masses   Cardiovascular: regular rate and rhythm   Respiratory:  no respiratory distress, normal breath sounds   Abdomen: soft, non-tender; bowel sounds normal; no masses,  no organomegaly     Assessment:   Pregnancy: G2P1001 Patient Active Problem List   Diagnosis Date Noted  . Supervision of high risk pregnancy, antepartum 10/31/2020  . Palpitations 07/07/2013     Plan:  1. Supervision of high risk pregnancy, antepartum Initial labs drawn. Continue prenatal vitamins. Genetic Screening discussed, NIPS: ordered. Ultrasound discussed; fetal anatomic survey: ordered. Problem list reviewed and updated. The nature of [facility] with  multiple MDs and other Advanced Practice Providers was explained to patient; also emphasized that residents, students are part of our team. - Culture, OB Urine - GC/Chlamydia probe amp ([place])not at [facility] - Genetic Screening - CBC/D/Plt+RPR+Rh+ABO+Rub Ab... - Hemoglobin A1c - AFP, Serum, Open Spina Bifida  Routine obstetric precautions reviewed. Return in 4 weeks (on 12/04/2020) for [facility], ob visit.

## 2020-11-06 NOTE — Patient Instructions (Signed)
 Second Trimester of Pregnancy  The second trimester of pregnancy is from week 13 through week 27. This is months 4 through 6 of pregnancy. The second trimester is often a time when you feel your best. Your body has adjusted to being pregnant, and you begin to feel better physically. During the second trimester:  Morning sickness has lessened or stopped completely.  You may have more energy.  You may have an increase in appetite. The second trimester is also a time when the unborn baby (fetus) is growing rapidly. At the end of the sixth month, the fetus may be up to 12 inches long and weigh about 1 pounds. You will likely begin to feel the baby move (quickening) between 16 and 20 weeks of pregnancy. Body changes during your second trimester Your body continues to go through many changes during your second trimester. The changes vary and generally return to normal after the baby is born. Physical changes  Your weight will continue to increase. You will notice your lower abdomen bulging out.  You may begin to get stretch marks on your hips, abdomen, and breasts.  Your breasts will continue to grow and to become tender.  Dark spots or blotches (chloasma or mask of pregnancy) may develop on your face.  A dark line from your belly button to the pubic area (linea nigra) may appear.  You may have changes in your hair. These can include thickening of your hair, rapid growth, and changes in texture. Some people also have hair loss during or after pregnancy, or hair that feels dry or thin. Health changes  You may develop headaches.  You may have heartburn.  You may develop constipation.  You may develop hemorrhoids or swollen, bulging veins (varicose veins).  Your gums may bleed and may be sensitive to brushing and flossing.  You may urinate more often because the fetus is pressing on your bladder.  You may have back pain. This is caused by: ? Weight gain. ? Pregnancy hormones  that are relaxing the joints in your pelvis. ? A shift in weight and the muscles that support your balance. Follow these instructions at home: Medicines  Follow your health care provider's instructions regarding medicine use. Specific medicines may be either safe or unsafe to take during pregnancy. Do not take any medicines unless approved by your health care provider.  Take a prenatal vitamin that contains at least 600 micrograms (mcg) of folic acid. Eating and drinking  Eat a healthy diet that includes fresh fruits and vegetables, whole grains, good sources of protein such as meat, eggs, or tofu, and low-fat dairy products.  Avoid raw meat and unpasteurized juice, milk, and cheese. These carry germs that can harm you and your baby.  You may need to take these actions to prevent or treat constipation: ? Drink enough fluid to keep your urine pale yellow. ? Eat foods that are high in fiber, such as beans, whole grains, and fresh fruits and vegetables. ? Limit foods that are high in fat and processed sugars, such as fried or sweet foods. Activity  Exercise only as directed by your health care provider. Most people can continue their usual exercise routine during pregnancy. Try to exercise for 30 minutes at least 5 days a week. Stop exercising if you develop contractions in your uterus.  Stop exercising if you develop pain or cramping in the lower abdomen or lower back.  Avoid exercising if it is very hot or humid or if you are   at a high altitude.  Avoid heavy lifting.  If you choose to, you may have sex unless your health care provider tells you not to. Relieving pain and discomfort  Wear a supportive bra to prevent discomfort from breast tenderness.  Take warm sitz baths to soothe any pain or discomfort caused by hemorrhoids. Use hemorrhoid cream if your health care provider approves.  Rest with your legs raised (elevated) if you have leg cramps or low back pain.  If you develop  varicose veins: ? Wear support hose as told by your health care provider. ? Elevate your feet for 15 minutes, 3-4 times a day. ? Limit salt in your diet. Safety  Wear your seat belt at all times when driving or riding in a car.  Talk with your health care provider if someone is verbally or physically abusive to you. Lifestyle  Do not use hot tubs, steam rooms, or saunas.  Do not douche. Do not use tampons or scented sanitary pads.  Avoid cat litter boxes and soil used by cats. These carry germs that can cause birth defects in the baby and possibly loss of the fetus by miscarriage or stillbirth.  Do not use herbal remedies, alcohol, illegal drugs, or medicines that are not approved by your health care provider. Chemicals in these products can harm your baby.  Do not use any products that contain nicotine or tobacco, such as cigarettes, e-cigarettes, and chewing tobacco. If you need help quitting, ask your health care provider. General instructions  During a routine prenatal visit, your health care provider will do a physical exam and other tests. He or she will also discuss your overall health. Keep all follow-up visits. This is important.  Ask your health care provider for a referral to a local prenatal education class.  Ask for help if you have counseling or nutritional needs during pregnancy. Your health care provider can offer advice or refer you to specialists for help with various needs. Where to find more information  American Pregnancy Association: americanpregnancy.org  American College of Obstetricians and Gynecologists: acog.org/en/Womens%20Health/Pregnancy  Office on Women's Health: womenshealth.gov/pregnancy Contact a health care provider if you have:  A headache that does not go away when you take medicine.  Vision changes or you see spots in front of your eyes.  Mild pelvic cramps, pelvic pressure, or nagging pain in the abdominal area.  Persistent nausea,  vomiting, or diarrhea.  A bad-smelling vaginal discharge or foul-smelling urine.  Pain when you urinate.  Sudden or extreme swelling of your face, hands, ankles, feet, or legs.  A fever. Get help right away if you:  Have fluid leaking from your vagina.  Have spotting or bleeding from your vagina.  Have severe abdominal cramping or pain.  Have difficulty breathing.  Have chest pain.  Have fainting spells.  Have not felt your baby move for the time period told by your health care provider.  Have new or increased pain, swelling, or redness in an arm or leg. Summary  The second trimester of pregnancy is from week 13 through week 27 (months 4 through 6).  Do not use herbal remedies, alcohol, illegal drugs, or medicines that are not approved by your health care provider. Chemicals in these products can harm your baby.  Exercise only as directed by your health care provider. Most people can continue their usual exercise routine during pregnancy.  Keep all follow-up visits. This is important. This information is not intended to replace advice given to you by   your health care provider. Make sure you discuss any questions you have with your health care provider. Document Revised: 02/14/2020 Document Reviewed: 12/21/2019 Elsevier Patient Education  2021 [employer]   Contraception Choices Contraception, also called birth control, refers to methods or devices that prevent pregnancy. Hormonal methods Contraceptive implant A contraceptive implant is a thin, plastic tube that contains a hormone that prevents pregnancy. It is different from an intrauterine device (IUD). It is inserted into the upper part of the arm by a health care provider. Implants can be effective for up to 3 years. Progestin-only injections Progestin-only injections are injections of progestin, a synthetic form of the hormone progesterone. They are given every 3 months by a health care provider. Birth control  pills Birth control pills are pills that contain hormones that prevent pregnancy. They must be taken once a day, preferably at the same time each day. A prescription is needed to use this method of contraception. Birth control patch The birth control patch contains hormones that prevent pregnancy. It is placed on the skin and must be changed once a week for three weeks and removed on the fourth week. A prescription is needed to use this method of contraception. Vaginal ring A vaginal ring contains hormones that prevent pregnancy. It is placed in the vagina for three weeks and removed on the fourth week. After that, the process is repeated with a new ring. A prescription is needed to use this method of contraception. Emergency contraceptive Emergency contraceptives prevent pregnancy after unprotected sex. They come in pill form and can be taken up to 5 days after sex. They work best the sooner they are taken after having sex. Most emergency contraceptives are available without a prescription. This method should not be used as your only form of birth control.   Barrier methods Female condom A female condom is a thin sheath that is worn over the penis during sex. Condoms keep sperm from going inside a woman's body. They can be used with a sperm-killing substance (spermicide) to increase their effectiveness. They should be thrown away after one use. Female condom A female condom is a soft, loose-fitting sheath that is put into the vagina before sex. The condom keeps sperm from going inside a woman's body. They should be thrown away after one use. Diaphragm A diaphragm is a soft, dome-shaped barrier. It is inserted into the vagina before sex, along with a spermicide. The diaphragm blocks sperm from entering the uterus, and the spermicide kills sperm. A diaphragm should be left in the vagina for 6-8 hours after sex and removed within 24 hours. A diaphragm is prescribed and fitted by a health care provider. A  diaphragm should be replaced every 1-2 years, after giving birth, after gaining more than 15 lb (6.8 kg), and after pelvic surgery. Cervical cap A cervical cap is a round, soft latex or plastic cup that fits over the cervix. It is inserted into the vagina before sex, along with spermicide. It blocks sperm from entering the uterus. The cap should be left in place for 6-8 hours after sex and removed within 48 hours. A cervical cap must be prescribed and fitted by a health care provider. It should be replaced every 2 years. Sponge A sponge is a soft, circular piece of polyurethane foam with spermicide in it. The sponge helps block sperm from entering the uterus, and the spermicide kills sperm. To use it, you make it wet and then insert it into the vagina. It should be   inserted before sex, left in for at least 6 hours after sex, and removed and thrown away within 30 hours. Spermicides Spermicides are chemicals that kill or block sperm from entering the cervix and uterus. They can come as a cream, jelly, suppository, foam, or tablet. A spermicide should be inserted into the vagina with an applicator at least 10-15 minutes before sex to allow time for it to work. The process must be repeated every time you have sex. Spermicides do not require a prescription.   Intrauterine contraception Intrauterine device (IUD) An IUD is a T-shaped device that is put in a woman's uterus. There are two types:  Hormone IUD.This type contains progestin, a synthetic form of the hormone progesterone. This type can stay in place for 3-5 years.  Copper IUD.This type is wrapped in copper wire. It can stay in place for 10 years. Permanent methods of contraception Female tubal ligation In this method, a woman's fallopian tubes are sealed, tied, or blocked during surgery to prevent eggs from traveling to the uterus. Hysteroscopic sterilization In this method, a small, flexible insert is placed into each fallopian tube. The inserts  cause scar tissue to form in the fallopian tubes and block them, so sperm cannot reach an egg. The procedure takes about 3 months to be effective. Another form of birth control must be used during those 3 months. Female sterilization This is a procedure to tie off the tubes that carry sperm (vasectomy). After the procedure, the man can still ejaculate fluid (semen). Another form of birth control must be used for 3 months after the procedure. Natural planning methods Natural family planning In this method, a couple does not have sex on days when the woman could become pregnant. Calendar method In this method, the woman keeps track of the length of each menstrual cycle, identifies the days when pregnancy can happen, and does not have sex on those days. Ovulation method In this method, a couple avoids sex during ovulation. Symptothermal method This method involves not having sex during ovulation. The woman typically checks for ovulation by watching changes in her temperature and in the consistency of cervical mucus. Post-ovulation method In this method, a couple waits to have sex until after ovulation. Where to find more information  Centers for Disease Control and Prevention: www.cdc.gov Summary  Contraception, also called birth control, refers to methods or devices that prevent pregnancy.  Hormonal methods of contraception include implants, injections, pills, patches, vaginal rings, and emergency contraceptives.  Barrier methods of contraception can include female condoms, female condoms, diaphragms, cervical caps, sponges, and spermicides.  There are two types of IUDs (intrauterine devices). An IUD can be put in a woman's uterus to prevent pregnancy for 3-5 years.  Permanent sterilization can be done through a procedure for males and females. Natural family planning methods involve nothaving sex on days when the woman could become pregnant. This information is not intended to replace advice  given to you by your health care provider. Make sure you discuss any questions you have with your health care provider. Document Revised: 02/12/2020 Document Reviewed: 02/12/2020 Elsevier Patient Education  2021 [employer] Inc.   Breastfeeding  Choosing to breastfeed is one of the best decisions you can make for yourself and your baby. A change in hormones during pregnancy causes your breasts to make breast milk in your milk-producing glands. Hormones prevent breast milk from being released before your baby is born. They also prompt milk flow after birth. Once breastfeeding has begun, thoughts of   your baby, as well as his or her sucking or crying, can stimulate the release of milk from your milk-producing glands. Benefits of breastfeeding Research shows that breastfeeding offers many health benefits for infants and mothers. It also offers a cost-free and convenient way to feed your baby. For your baby  Your first milk (colostrum) helps your baby's digestive system to function better.  Special cells in your milk (antibodies) help your baby to fight off infections.  Breastfed babies are less likely to develop asthma, allergies, obesity, or type 2 diabetes. They are also at lower risk for sudden infant death syndrome (SIDS).  Nutrients in breast milk are better able to meet your baby's needs compared to infant formula.  Breast milk improves your baby's brain development. For you  Breastfeeding helps to create a very special bond between you and your baby.  Breastfeeding is convenient. Breast milk costs nothing and is always available at the correct temperature.  Breastfeeding helps to burn calories. It helps you to lose the weight that you gained during pregnancy.  Breastfeeding makes your uterus return faster to its size before pregnancy. It also slows bleeding (lochia) after you give birth.  Breastfeeding helps to lower your risk of developing type 2 diabetes, osteoporosis, rheumatoid  arthritis, cardiovascular disease, and breast, ovarian, uterine, and endometrial cancer later in life. Breastfeeding basics Starting breastfeeding  Find a comfortable place to sit or lie down, with your neck and back well-supported.  Place a pillow or a rolled-up blanket under your baby to bring him or her to the level of your breast (if you are seated). Nursing pillows are specially designed to help support your arms and your baby while you breastfeed.  Make sure that your baby's tummy (abdomen) is facing your abdomen.  Gently massage your breast. With your fingertips, massage from the outer edges of your breast inward toward the nipple. This encourages milk flow. If your milk flows slowly, you may need to continue this action during the feeding.  Support your breast with 4 fingers underneath and your thumb above your nipple (make the letter "C" with your hand). Make sure your fingers are well away from your nipple and your baby's mouth.  Stroke your baby's lips gently with your finger or nipple.  When your baby's mouth is open wide enough, quickly bring your baby to your breast, placing your entire nipple and as much of the areola as possible into your baby's mouth. The areola is the colored area around your nipple. ? More areola should be visible above your baby's upper lip than below the lower lip. ? Your baby's lips should be opened and extended outward (flanged) to ensure an adequate, comfortable latch. ? Your baby's tongue should be between his or her lower gum and your breast.  Make sure that your baby's mouth is correctly positioned around your nipple (latched). Your baby's lips should create a seal on your breast and be turned out (everted).  It is common for your baby to suck about 2-3 minutes in order to start the flow of breast milk. Latching Teaching your baby how to latch onto your breast properly is very important. An improper latch can cause nipple pain, decreased milk  supply, and poor weight gain in your baby. Also, if your baby is not latched onto your nipple properly, he or she may swallow some air during feeding. This can make your baby fussy. Burping your baby when you switch breasts during the feeding can help to get rid   of the air. However, teaching your baby to latch on properly is still the best way to prevent fussiness from swallowing air while breastfeeding. Signs that your baby has successfully latched onto your nipple  Silent tugging or silent sucking, without causing you pain. Infant's lips should be extended outward (flanged).  Swallowing heard between every 3-4 sucks once your milk has started to flow (after your let-down milk reflex occurs).  Muscle movement above and in front of his or her ears while sucking. Signs that your baby has not successfully latched onto your nipple  Sucking sounds or smacking sounds from your baby while breastfeeding.  Nipple pain. If you think your baby has not latched on correctly, slip your finger into the corner of your baby's mouth to break the suction and place it between your baby's gums. Attempt to start breastfeeding again. Signs of successful breastfeeding Signs from your baby  Your baby will gradually decrease the number of sucks or will completely stop sucking.  Your baby will fall asleep.  Your baby's body will relax.  Your baby will retain a small amount of milk in his or her mouth.  Your baby will let go of your breast by himself or herself. Signs from you  Breasts that have increased in firmness, weight, and size 1-3 hours after feeding.  Breasts that are softer immediately after breastfeeding.  Increased milk volume, as well as a change in milk consistency and color by the fifth day of breastfeeding.  Nipples that are not sore, cracked, or bleeding. Signs that your baby is getting enough milk  Wetting at least 1-2 diapers during the first 24 hours after birth.  Wetting at least 5-6  diapers every 24 hours for the first week after birth. The urine should be clear or pale yellow by the age of 5 days.  Wetting 6-8 diapers every 24 hours as your baby continues to grow and develop.  At least 3 stools in a 24-hour period by the age of 5 days. The stool should be soft and yellow.  At least 3 stools in a 24-hour period by the age of 7 days. The stool should be seedy and yellow.  No loss of weight greater than 10% of birth weight during the first 3 days of life.  Average weight gain of 4-7 oz (113-198 g) per week after the age of 4 days.  Consistent daily weight gain by the age of 5 days, without weight loss after the age of 2 weeks. After a feeding, your baby may spit up a small amount of milk. This is normal. Breastfeeding frequency and duration Frequent feeding will help you make more milk and can prevent sore nipples and extremely full breasts (breast engorgement). Breastfeed when you feel the need to reduce the fullness of your breasts or when your baby shows signs of hunger. This is called "breastfeeding on demand." Signs that your baby is hungry include:  Increased alertness, activity, or restlessness.  Movement of the head from side to side.  Opening of the mouth when the corner of the mouth or cheek is stroked (rooting).  Increased sucking sounds, smacking lips, cooing, sighing, or squeaking.  Hand-to-mouth movements and sucking on fingers or hands.  Fussing or crying. Avoid introducing a pacifier to your baby in the first 4-6 weeks after your baby is born. After this time, you may choose to use a pacifier. Research has shown that pacifier use during the first year of a baby's life decreases the risk of   sudden infant death syndrome (SIDS). Allow your baby to feed on each breast as long as he or she wants. When your baby unlatches or falls asleep while feeding from the first breast, offer the second breast. Because newborns are often sleepy in the first few weeks of  life, you may need to awaken your baby to get him or her to feed. Breastfeeding times will vary from baby to baby. However, the following rules can serve as a guide to help you make sure that your baby is properly fed:  Newborns (babies 4 weeks of age or younger) may breastfeed every 1-3 hours.  Newborns should not go without breastfeeding for longer than 3 hours during the day or 5 hours during the night.  You should breastfeed your baby a minimum of 8 times in a 24-hour period. Breast milk pumping Pumping and storing breast milk allows you to make sure that your baby is exclusively fed your breast milk, even at times when you are unable to breastfeed. This is especially important if you go back to work while you are still breastfeeding, or if you are not able to be present during feedings. Your lactation consultant can help you find a method of pumping that works best for you and give you guidelines about how long it is safe to store breast milk.      Caring for your breasts while you breastfeed Nipples can become dry, cracked, and sore while breastfeeding. The following recommendations can help keep your breasts moisturized and healthy:  Avoid using soap on your nipples.  Wear a supportive bra designed especially for nursing. Avoid wearing underwire-style bras or extremely tight bras (sports bras).  Air-dry your nipples for 3-4 minutes after each feeding.  Use only cotton bra pads to absorb leaked breast milk. Leaking of breast milk between feedings is normal.  Use lanolin on your nipples after breastfeeding. Lanolin helps to maintain your skin's normal moisture barrier. Pure lanolin is not harmful (not toxic) to your baby. You may also hand express a few drops of breast milk and gently massage that milk into your nipples and allow the milk to air-dry. In the first few weeks after giving birth, some women experience breast engorgement. Engorgement can make your breasts feel heavy, warm,  and tender to the touch. Engorgement peaks within 3-5 days after you give birth. The following recommendations can help to ease engorgement:  Completely empty your breasts while breastfeeding or pumping. You may want to start by applying warm, moist heat (in the shower or with warm, water-soaked hand towels) just before feeding or pumping. This increases circulation and helps the milk flow. If your baby does not completely empty your breasts while breastfeeding, pump any extra milk after he or she is finished.  Apply ice packs to your breasts immediately after breastfeeding or pumping, unless this is too uncomfortable for you. To do this: ? Put ice in a plastic bag. ? Place a towel between your skin and the bag. ? Leave the ice on for 20 minutes, 2-3 times a day.  Make sure that your baby is latched on and positioned properly while breastfeeding. If engorgement persists after 48 hours of following these recommendations, contact your health care provider or a lactation consultant. Overall health care recommendations while breastfeeding  Eat 3 healthy meals and 3 snacks every day. Well-nourished mothers who are breastfeeding need an additional 450-500 calories a day. You can meet this requirement by increasing the amount of a balanced diet that   you eat.  Drink enough water to keep your urine pale yellow or clear.  Rest often, relax, and continue to take your prenatal vitamins to prevent fatigue, stress, and low vitamin and mineral levels in your body (nutrient deficiencies).  Do not use any products that contain nicotine or tobacco, such as cigarettes and e-cigarettes. Your baby may be harmed by chemicals from cigarettes that pass into breast milk and exposure to secondhand smoke. If you need help quitting, ask your health care provider.  Avoid alcohol.  Do not use illegal drugs or marijuana.  Talk with your health care provider before taking any medicines. These include over-the-counter and  prescription medicines as well as vitamins and herbal supplements. Some medicines that may be harmful to your baby can pass through breast milk.  It is possible to become pregnant while breastfeeding. If birth control is desired, ask your health care provider about options that will be safe while breastfeeding your baby. Where to find more information: [employer]: www.llli.org Contact a health care provider if:  You feel like you want to stop breastfeeding or have become frustrated with breastfeeding.  Your nipples are cracked or bleeding.  Your breasts are red, tender, or warm.  You have: ? Painful breasts or nipples. ? A swollen area on either breast. ? A fever or chills. ? Nausea or vomiting. ? Drainage other than breast milk from your nipples.  Your breasts do not become full before feedings by the fifth day after you give birth.  You feel sad and depressed.  Your baby is: ? Too sleepy to eat well. ? Having trouble sleeping. ? More than 1 week old and wetting fewer than 6 diapers in a 24-hour period. ? Not gaining weight by 5 days of age.  Your baby has fewer than 3 stools in a 24-hour period.  Your baby's skin or the white parts of his or her eyes become yellow. Get help right away if:  Your baby is overly tired (lethargic) and does not want to wake up and feed.  Your baby develops an unexplained fever. Summary  Breastfeeding offers many health benefits for infant and mothers.  Try to breastfeed your infant when he or she shows early signs of hunger.  Gently tickle or stroke your baby's lips with your finger or nipple to allow the baby to open his or her mouth. Bring the baby to your breast. Make sure that much of the areola is in your baby's mouth. Offer one side and burp the baby before you offer the other side.  Talk with your health care provider or lactation consultant if you have questions or you face problems as you breastfeed. This  information is not intended to replace advice given to you by your health care provider. Make sure you discuss any questions you have with your health care provider. Document Revised: 12/02/2017 Document Reviewed: 10/09/2016 Elsevier Patient Education  2021 Elsevier Inc.  

## 2020-11-07 ENCOUNTER — Encounter: Payer: Self-pay | Admitting: *Deleted

## 2020-11-08 LAB — URINE CULTURE, OB REFLEX

## 2020-11-08 LAB — CULTURE, OB URINE

## 2020-11-14 LAB — HEMOGLOBIN A1C
Est. average glucose Bld gHb Est-mCnc: 82 mg/dL
Hgb A1c MFr Bld: 4.5 % — ABNORMAL LOW (ref 4.8–5.6)

## 2020-11-14 LAB — CBC/D/PLT+RPR+RH+ABO+RUB AB...
Antibody Screen: NEGATIVE
Basophils Absolute: 0 10*3/uL (ref 0.0–0.2)
Basos: 0 %
EOS (ABSOLUTE): 0.1 10*3/uL (ref 0.0–0.4)
Eos: 1 %
HCV Ab: <0.1 s/co ratio (ref 0.0–0.9)
HIV Screen 4th Generation wRfx: NONREACTIVE
Hematocrit: 36.4 % (ref 34.0–46.6)
Hemoglobin: 12.7 g/dL (ref 11.1–15.9)
Hepatitis B Surface Ag: NEGATIVE
Immature Grans (Abs): 0 10*3/uL (ref 0.0–0.1)
Immature Granulocytes: 0 %
Lymphocytes Absolute: 1.2 10*3/uL (ref 0.7–3.1)
Lymphs: 17 %
MCH: 31.7 pg (ref 26.6–33.0)
MCHC: 34.9 g/dL (ref 31.5–35.7)
MCV: 91 fL (ref 79–97)
Monocytes Absolute: 0.3 10*3/uL (ref 0.1–0.9)
Monocytes: 5 %
Neutrophils Absolute: 5.3 10*3/uL (ref 1.4–7.0)
Neutrophils: 77 %
Platelets: 205 10*3/uL (ref 150–450)
RBC: 4.01 x10E6/uL (ref 3.77–5.28)
RDW: 11.8 % (ref 11.7–15.4)
RPR Ser Ql: NONREACTIVE
Rh Factor: POSITIVE
Rubella Antibodies, IGG: 1.12 index (ref >0.99)
WBC: 6.9 10*3/uL (ref 3.4–10.8)

## 2020-11-14 LAB — AFP, SERUM, OPEN SPINA BIFIDA
AFP MoM: 1.38
AFP Value: 52.1 ng/mL
Gest. Age on Collection Date: 17 weeks
Maternal Age At EDD: 24.4 yr
OSBR Risk 1 IN: 3810
Test Results:: NEGATIVE
Weight: 155 [lb_av]

## 2020-11-14 LAB — HCV INTERPRETATION

## 2020-11-15 ENCOUNTER — Encounter: Payer: Self-pay | Admitting: *Deleted

## 2020-11-21 ENCOUNTER — Encounter: Payer: Self-pay | Admitting: Family Medicine

## 2020-11-21 ENCOUNTER — Other Ambulatory Visit: Payer: Self-pay

## 2020-11-21 ENCOUNTER — Ambulatory Visit: Payer: Medicaid Other | Attending: Family Medicine

## 2020-11-21 DIAGNOSIS — O0990 Supervision of high risk pregnancy, unspecified, unspecified trimester: Secondary | ICD-10-CM | POA: Diagnosis not present

## 2020-11-21 DIAGNOSIS — O4440 Low lying placenta NOS or without hemorrhage, unspecified trimester: Secondary | ICD-10-CM | POA: Insufficient documentation

## 2020-11-22 ENCOUNTER — Other Ambulatory Visit: Payer: Self-pay | Admitting: Obstetrics and Gynecology

## 2020-11-22 DIAGNOSIS — O4402 Placenta previa specified as without hemorrhage, second trimester: Secondary | ICD-10-CM

## 2020-11-27 ENCOUNTER — Encounter: Payer: Self-pay | Admitting: *Deleted

## 2020-12-03 ENCOUNTER — Other Ambulatory Visit: Payer: Self-pay

## 2020-12-03 ENCOUNTER — Ambulatory Visit (INDEPENDENT_AMBULATORY_CARE_PROVIDER_SITE_OTHER): Payer: Medicaid Other | Admitting: Advanced Practice Midwife

## 2020-12-03 VITALS — BP 101/66 | HR 76 | Wt 160.1 lb

## 2020-12-03 DIAGNOSIS — Z3A20 20 weeks gestation of pregnancy: Secondary | ICD-10-CM

## 2020-12-03 DIAGNOSIS — Z3492 Encounter for supervision of normal pregnancy, unspecified, second trimester: Secondary | ICD-10-CM

## 2020-12-03 DIAGNOSIS — O2242 Hemorrhoids in pregnancy, second trimester: Secondary | ICD-10-CM | POA: Insufficient documentation

## 2020-12-03 DIAGNOSIS — O4442 Low lying placenta NOS or without hemorrhage, second trimester: Secondary | ICD-10-CM

## 2020-12-03 DIAGNOSIS — K649 Unspecified hemorrhoids: Secondary | ICD-10-CM

## 2020-12-03 MED ORDER — HYDROCORTISONE ACETATE 25 MG RE SUPP: 25.0000 mg | Freq: Two times a day (BID) | RECTAL | 1 refills | Status: AC

## 2020-12-03 MED ORDER — DOCUSATE SODIUM 100 MG PO CAPS: 100.0000 mg | ORAL_CAPSULE | Freq: Two times a day (BID) | ORAL | 2 refills | Status: DC | PRN

## 2020-12-03 MED ORDER — POLYETHYLENE GLYCOL 3350 17 G PO PACK: 17.0000 g | PACK | Freq: Every day | ORAL | 0 refills | Status: DC

## 2020-12-03 NOTE — Progress Notes (Signed)
   PRENATAL VISIT NOTE  Subjective:  Abigail Morrow is a 25 y.o. G2P1001 at [redacted]w[redacted]d being seen today for ongoing prenatal care.  She is currently monitored for the following issues for this low-risk pregnancy and has Palpitations; Supervision of high risk pregnancy, antepartum; Low-lying placenta; and Hemorrhoids during pregnancy in second trimester on their problem list.  Patient reports intermittent hemorrhoids, not present today. Onset of problem with previous pregnancy..  Contractions: Not present. Vag. Bleeding: None.  Movement: Present. Denies leaking of fluid.   Patient desires waterbirth. Hx successful waterbirth at [facility] with P1.  The following portions of the patient's history were reviewed and updated as appropriate: allergies, current medications, past family history, past medical history, past social history, past surgical history and problem list. Problem list updated.  Objective:   Vitals:   12/03/20 0929  BP: 101/66  Pulse: 76  Weight: 160 lb 1.6 oz (72.6 kg)    Fetal Status: Fetal Heart Rate (bpm): 140   Movement: Present     General:  Alert, oriented and cooperative. Patient is in no acute distress.  Skin: Skin is warm and dry. No rash noted.   Cardiovascular: Normal heart rate noted  Respiratory: Normal respiratory effort, no problems with respiration noted  Abdomen: Soft, gravid, appropriate for gestational age.  Pain/Pressure: Absent     Pelvic: Cervical exam deferred        Extremities: Normal range of motion.  Edema: None  Mental Status: Normal mood and affect. Normal behavior. Normal judgment and thought content.   Assessment and Plan:  Pregnancy: G2P1001 at [redacted]w[redacted]d  1. Encounter for supervision of low-risk pregnancy in second trimester - Routine care - Reviewed requirements for waterbirth  2. Hemorrhoids, unspecified hemorrhoid type - Not present, declined physical exam - polyethylene glycol (MIRALAX) 17 g packet; Take 17 g by mouth daily.   Dispense: 14 each; Refill: 0 - docusate sodium (COLACE) 100 MG capsule; Take 1 capsule (100 mg total) by mouth 2 (two) times daily as needed.  Dispense: 30 capsule; Refill: 2 - hydrocortisone (ANUSOL-HC) 25 MG suppository; Place 1 suppository (25 mg total) rectally 2 (two) times daily.  Dispense: 12 suppository; Refill: 1  3. [redacted] weeks gestation of pregnancy   4. Low Lying Placenta - Must resolve to be eligible for waterbirth, discussed high likelihood of this happening - Continue serial evaluation with MFM, pt aware of schedule  Preterm labor symptoms and general obstetric precautions including but not limited to vaginal bleeding, contractions, leaking of fluid and fetal movement were reviewed in detail with the patient. Please refer to After Visit Summary for other counseling recommendations.  Return in about 4 weeks (around 12/31/2020) for CNMs preferred.  Future Appointments  Date Time Provider Department Center  01/02/2021  9:35 AM Marylene Land, CNM [facility]  01/16/2021  8:45 AM [facility] US[facility]    Calvert Cantor, CNM

## 2020-12-03 NOTE — Patient Instructions (Addendum)
Second Trimester of Pregnancy  The second trimester of pregnancy is from week 13 through week 27. This is also called months 4 through 6 of pregnancy. This is often the time when you feel your best. During the second trimester:  Morning sickness is less or has stopped.  You may have more energy.  You may feel hungry more often. At this time, your unborn baby (fetus) is growing very fast. At the end of the sixth month, the unborn baby may be up to 12 inches long and weigh about 1 pounds. You will likely start to feel the baby move between 16 and 20 weeks of pregnancy. Body changes during your second trimester Your body continues to go through many changes during this time. The changes vary and generally return to normal after the baby is born. Physical changes  You will gain more weight.  You may start to get stretch marks on your hips, belly (abdomen), and breasts.  Your breasts will grow and may hurt.  Dark spots or blotches may develop on your face.  A dark line from your belly button to the pubic area (linea nigra) may appear.  You may have changes in your hair. Health changes  You may have headaches.  You may have heartburn.  You may have trouble pooping (constipation).  You may have hemorrhoids or swollen, bulging veins (varicose veins).  Your gums may bleed.  You may pee (urinate) more often.  You may have back pain. Follow these instructions at home: Medicines  Take over-the-counter and prescription medicines only as told by your doctor. Some medicines are not safe during pregnancy.  Take a prenatal vitamin that contains at least 600 micrograms (mcg) of folic acid. Eating and drinking  Eat healthy meals that include: ? Fresh fruits and vegetables. ? Whole grains. ? Good sources of protein, such as meat, eggs, or tofu. ? Low-fat dairy products.  Avoid raw meat and unpasteurized juice, milk, and cheese.  You may need to take these actions to prevent or  treat trouble pooping: ? Drink enough fluids to keep your pee (urine) pale yellow. ? Eat foods that are high in fiber. These include beans, whole grains, and fresh fruits and vegetables. ? Limit foods that are high in fat and sugar. These include fried or sweet foods. Activity  Exercise only as told by your doctor. Most people can do their usual exercise during pregnancy. Try to exercise for 30 minutes at least 5 days a week.  Stop exercising if you have pain or cramps in your belly or lower back.  Do not exercise if it is too hot or too humid, or if you are in a place of great height (high altitude).  Avoid heavy lifting.  If you choose to, you may have sex unless your doctor tells you not to. Relieving pain and discomfort  Wear a good support bra if your breasts are sore.  Take warm water baths (sitz baths) to soothe pain or discomfort caused by hemorrhoids. Use hemorrhoid cream if your doctor approves.  Rest with your legs raised (elevated) if you have leg cramps or low back pain.  If you develop bulging veins in your legs: ? Wear support hose as told by your doctor. ? Raise your feet for 15 minutes, 3-4 times a day. ? Limit salt in your food. Safety  Wear your seat belt at all times when you are in a car.  Talk with your doctor if someone is hurting you or yelling  at you a lot. Lifestyle  Do not use hot tubs, steam rooms, or saunas.  Do not douche. Do not use tampons or scented sanitary pads.  Avoid cat litter boxes and soil used by cats. These carry germs that can harm your baby and can cause a loss of your baby by miscarriage or stillbirth.  Do not use herbal medicines, illegal drugs, or medicines that are not approved by your doctor. Do not drink alcohol.  Do not smoke or use any products that contain nicotine or tobacco. If you need help quitting, ask your doctor. General instructions  Keep all follow-up visits. This is important.  Ask your doctor about local  prenatal classes.  Ask your doctor about the right foods to eat or for help finding a counselor. Where to find more information  American Pregnancy Association: americanpregnancy.org  [employer]nec[employer]: www.acog.org  Office on [employer] Health: MightyReward.co.nz Contact a doctor if:  You have a headache that does not go away when you take medicine.  You have changes in how you see, or you see spots in front of your eyes.  You have mild cramps, pressure, or pain in your lower belly.  You continue to feel like you may vomit (nauseous), you vomit, or you have watery poop (diarrhea).  You have bad-smelling fluid coming from your vagina.  You have pain when you pee or your pee smells bad.  You have very bad swelling of your face, hands, ankles, feet, or legs.  You have a fever. Get help right away if:  You are leaking fluid from your vagina.  You have spotting or bleeding from your vagina.  You have very bad belly cramping or pain.  You have trouble breathing.  You have chest pain.  You faint.  You have not felt your baby move for the time period told by your doctor.  You have new or increased pain, swelling, or redness in an arm or leg. Summary  The second trimester of pregnancy is from week 13 through week 27 (months 4 through 6).  Eat healthy meals.  Exercise as told by your doctor. Most people can do their usual exercise during pregnancy.  Do not use herbal medicines, illegal drugs, or medicines that are not approved by your doctor. Do not drink alcohol.  Call your doctor if you get sick or if you notice anything unusual about your pregnancy. This information is not intended to replace advice given to you by your health care provider. Make sure you discuss any questions you have with your health care provider. Document Revised: 02/14/2020 Document Reviewed: 12/21/2019 Elsevier Patient Education  2021 Elsevier  Inc.   Constipation, Adult Constipation is when a person has trouble pooping (having a bowel movement). When you have this condition, you may poop fewer than 3 times a week. Your poop (stool) may also be dry, hard, or bigger than normal. Follow these instructions at home: Eating and drinking  Eat foods that have a lot of fiber, such as: ? Fresh fruits and vegetables. ? Whole grains. ? Beans.  Eat less of foods that are low in fiber and high in fat and sugar, such as: ? [place] fries. ? Hamburgers. ? Cookies. ? Candy. ? Soda.  Drink enough fluid to keep your pee (urine) pale yellow.   General instructions  Exercise regularly or as told by your doctor. Try to do 150 minutes of exercise each week.  Go to the restroom when you feel like you  need to poop. Do not hold it in.  Take over-the-counter and prescription medicines only as told by your doctor. These include any fiber supplements.  When you poop: ? Do deep breathing while relaxing your lower belly (abdomen). ? Relax your pelvic floor. The pelvic floor is a group of muscles that support the rectum, bladder, and intestines (as well as the uterus in women).  Watch your condition for any changes. Tell your doctor if you notice any.  Keep all follow-up visits as told by your doctor. This is important. Contact a doctor if:  You have pain that gets worse.  You have a fever.  You have not pooped for 4 days.  You vomit.  You are not hungry.  You lose weight.  You are bleeding from the opening of the butt (anus).  You have thin, pencil-like poop. Get help right away if:  You have a fever, and your symptoms suddenly get worse.  You leak poop or have blood in your poop.  Your belly feels hard or bigger than normal (bloated).  You have very bad belly pain.  You feel dizzy or you faint. Summary  Constipation is when a person poops fewer than 3 times a week, has trouble pooping, or has poop that is dry, hard, or  bigger than normal.  Eat foods that have a lot of fiber.  Drink enough fluid to keep your pee (urine) pale yellow.  Take over-the-counter and prescription medicines only as told by your doctor. These include any fiber supplements. This information is not intended to replace advice given to you by your health care provider. Make sure you discuss any questions you have with your health care provider. Document Revised: 07/26/2019 Document Reviewed: 07/26/2019 Elsevier Patient Education  2021 [employer].   Considering [place]? Guide for patients at [facility] for [employer] [facility]) Why consider waterbirth? . Gentle birth for babies  . Less pain medicine used in labor  . May allow for passive descent/less pushing  . May reduce perineal tears  . More mobility and instinctive maternal position changes  . Increased maternal relaxation   Is waterbirth safe? What are the risks of infection, drowning or other complications? . Infection:  Marland Kitchen Very low risk (3.7 % for tub vs 4.8% for bed)  . 7 in 8000 waterbirths with documented infection  . Poorly cleaned equipment most common cause  . Slightly lower group B strep transmission rate  . Drowning  . Maternal:  . Very low risk  . Related to seizures or fainting  . Newborn:  Marland Kitchen Very low risk. No evidence of increased risk of respiratory problems in multiple large studies  . Physiological protection from breathing under water  . Avoid underwater birth if there are any fetal complications  . Once baby's head is out of the water, keep it out.  . Birth complication  . Some reports of cord trauma, but risk decreased by bringing baby to surface gradually  . No evidence of increased risk of shoulder dystocia. Mothers can usually change positions faster in water than in a bed, possibly aiding the maneuvers to free the shoulder.   There are 2 things you MUST do to have a waterbirth with [facility]: 1. Attend a waterbirth class at [facility] at [facility]   a. 3rd Wednesday of every month from 7-9 pm (virtual during COVID) b. Free c. Register online at [email or web address] or [email or web address] or by calling [phone] d. Bring [place] the certificate from the  class to your prenatal appointment or send via MyChart 2. Meet with a midwife at 36 weeks* to see if you can still plan a waterbirth and to sign the consent.   *We also recommend that you schedule as many of your prenatal visits with a midwife as possible.    Helpful information: . You may want to bring a bathing suit top to the hospital to wear during labor but this is optional.  All other supplies are provided by the hospital. . Please arrive at the hospital with signs of active labor, and do not wait at home until late in labor. It takes 45 min- 2 hours for COVID testing, fetal monitoring, and check in to your room to take place, plus transport and filling of the waterbirth tub.    Things that would prevent you from having a waterbirth: . Unknown or Positive COVID-19 diagnosis upon admission to hospital* . Premature, <37wks  . Previous cesarean birth  . Presence of thick meconium-stained fluid  . Multiple gestation (Twins, triplets, etc.)  . Uncontrolled diabetes or gestational diabetes requiring medication  . Hypertension diagnosed in pregnancy or preexisting hypertension (gestational hypertension, preeclampsia, or chronic hypertension) . Heavy vaginal bleeding  . Non-reassuring fetal heart rate  . Active infection (MRSA, etc.). Group B Strep is NOT a contraindication for waterbirth.  . If your labor has to be induced and induction method requires continuous monitoring of the baby's heart rate  . Other risks/issues identified by your obstetrical provider   Please remember that birth is unpredictable. Under certain unforeseeable circumstances your provider may advise against giving birth in the tub. These decisions will be made on a case-by-case basis  and with the safety of you and your baby as our highest priority.   *Please remember that in order to have a waterbirth, you must test Negative to COVID-19 upon admission to the hospital.  Updated 08/06/2020

## 2020-12-03 NOTE — Progress Notes (Signed)
Pt concerned that she has hemorrhoids.

## 2021-01-02 ENCOUNTER — Ambulatory Visit (INDEPENDENT_AMBULATORY_CARE_PROVIDER_SITE_OTHER): Payer: Medicaid Other | Admitting: Student

## 2021-01-02 VITALS — BP 105/72 | HR 70 | Wt 162.6 lb

## 2021-01-02 DIAGNOSIS — Z3A25 25 weeks gestation of pregnancy: Secondary | ICD-10-CM

## 2021-01-02 DIAGNOSIS — O0990 Supervision of high risk pregnancy, unspecified, unspecified trimester: Secondary | ICD-10-CM

## 2021-01-02 NOTE — Patient Instructions (Addendum)
Considering Waterbirth? Guide for patients at [facility]) Why consider waterbirth? . Gentle birth for babies  . Less pain medicine used in labor  . May allow for passive descent/less pushing  . May reduce perineal tears  . More mobility and instinctive maternal position changes  . Increased maternal relaxation   Is waterbirth safe? What are the risks of infection, drowning or other complications? . Infection:  Marland Kitchen Very low risk (3.7 % for tub vs 4.8% for bed)  . 7 in 10 waterbirths with documented infection  . Poorly cleaned equipment most common cause  . Slightly lower group B strep transmission rate  . Drowning  . Maternal:  . Very low risk  . Related to seizures or fainting  . Newborn:  Marland Kitchen Very low risk. No evidence of increased risk of respiratory problems in multiple large studies  . Physiological protection from breathing under water  . Avoid underwater birth if there are any fetal complications  . Once baby's head is out of the water, keep it out.  . Birth complication  . Some reports of cord trauma, but risk decreased by bringing baby to surface gradually  . No evidence of increased risk of shoulder dystocia. Mothers can usually change positions faster in water than in a bed, possibly aiding the maneuvers to free the shoulder.   There are 2 things you MUST do to have a waterbirth with [facility]: 1. Attend a waterbirth class at [place] at [facility]   a. 3rd Wednesday of every month from 7-9 pm (virtual during [place]) b. Free c. Register online at [email or web address] or [email or web address] or by calling [phone] d. Bring [place] the certificate from the class to your prenatal appointment or send via MyChart 2. Meet with a midwife at 36 weeks* to see if you can still plan a waterbirth and to sign the consent.   *We also recommend that you schedule as many of your prenatal visits with a midwife as possible.    Helpful  information: . You may want to bring a bathing suit top to the hospital to wear during labor but this is optional.  All other supplies are provided by the hospital. . Please arrive at the hospital with signs of active labor, and do not wait at home until late in labor. It takes 45 min- 2 hours for COVID testing, fetal monitoring, and check in to your room to take place, plus transport and filling of the waterbirth tub.    Things that would prevent you from having a waterbirth: . Unknown or Positive COVID-19 diagnosis upon admission to hospital* . Premature, <37wks  . Previous cesarean birth  . Presence of thick meconium-stained fluid  . Multiple gestation (Twins, triplets, etc.)  . Uncontrolled diabetes or gestational diabetes requiring medication  . Hypertension diagnosed in pregnancy or preexisting hypertension (gestational hypertension, preeclampsia, or chronic hypertension) . Fetal growth restriction (your baby measures less than 10th percentile on ultrasound) . Heavy vaginal bleeding  . Non-reassuring fetal heart rate  . Active infection (MRSA, etc.). Group B Strep is NOT a contraindication for waterbirth.  . If your labor has to be induced and induction method requires continuous monitoring of the baby's heart rate  . Other risks/issues identified by your obstetrical provider   Please remember that birth is unpredictable. Under certain unforeseeable circumstances your provider may advise against giving birth in the tub. These decisions will be made on a case-by-case basis and with the safety of you  and your baby as our highest priority.   *Please remember that in order to have a waterbirth, you must test Negative to COVID-19 upon admission to the hospital.  Updated 12/30/20  Oral Glucose Tolerance Test During Pregnancy Why am I having this test? The oral glucose tolerance test (OGTT) is done to check how your body processes blood sugar (glucose). This is one of several tests used to  diagnose diabetes that develops during pregnancy (gestational diabetes mellitus). Gestational diabetes is a short-term form of diabetes that some women develop while they are pregnant. It usually occurs during the second trimester of pregnancy and goes away after delivery. Testing, or screening, for gestational diabetes usually occurs at weeks 24-28 of pregnancy. You may have the OGTT test after having a 1-hour glucose screening test if the results from that test indicate that you may have gestational diabetes. This test may also be needed if:  You have a history of gestational diabetes.  There is a history of giving birth to very large babies or of losing pregnancies (having stillbirths).  You have signs and symptoms of diabetes, such as: ? Changes in your eyesight. ? Tingling or numbness in your hands or feet. ? Changes in hunger, thirst, and urination, and these are not explained by your pregnancy. What is being tested? This test measures the amount of glucose in your blood at different times during a period of 3 hours. This shows how well your body can process glucose. What kind of sample is taken? Blood samples are required for this test. They are usually collected by inserting a needle into a blood vessel.   How do I prepare for this test?  For 3 days before your test, eat normally. Have plenty of carbohydrate-rich foods.  Follow instructions from your health care provider about: ? Eating or drinking restrictions on the day of the test. You may be asked not to eat or drink anything other than water (to fast) starting 8-10 hours before the test. ? Changing or stopping your regular medicines. Some medicines may interfere with this test. Tell a health care provider about:  All medicines you are taking, including vitamins, herbs, eye drops, creams, and over-the-counter medicines.  Any blood disorders you have.  Any surgeries you have had.  Any medical conditions you have. What  happens during the test? First, your blood glucose will be measured. This is referred to as your fasting blood glucose because you fasted before the test. Then, you will drink a glucose solution that contains a certain amount of glucose. Your blood glucose will be measured again 1, 2, and 3 hours after you drink the solution. This test takes about 3 hours to complete. You will need to stay at the testing location during this time. During the testing period:  Do not eat or drink anything other than the glucose solution.  Do not exercise.  Do not use any products that contain nicotine or tobacco, such as cigarettes, e-cigarettes, and chewing tobacco. These can affect your test results. If you need help quitting, ask your health care provider. The testing procedure may vary among health care providers and hospitals. How are the results reported? Your results will be reported as milligrams of glucose per deciliter of blood (mg/dL) or millimoles per liter (mmol/L). There is more than one source for screening and diagnosis reference values used to diagnose gestational diabetes. Your health care provider will compare your results to normal values that were established after testing a large group of  people (reference values). Reference values may vary among labs and hospitals. For this test (Carpenter-Coustan), reference values are:  Fasting: 95 mg/dL (5.3 mmol/L).  1 hour: 180 mg/dL (14.7 mmol/L).  2 hour: 155 mg/dL (8.6 mmol/L).  3 hour: 140 mg/dL (7.8 mmol/L). What do the results mean? Results below the reference values are considered normal. If two or more of your blood glucose levels are at or above the reference values, you may be diagnosed with gestational diabetes. If only one level is high, your health care provider may suggest repeat testing or other tests to confirm a diagnosis. Talk with your health care provider about what your results mean. Questions to ask your health care provider Ask  your health care provider, or the department that is doing the test:  When will my results be ready?  How will I get my results?  What are my treatment options?  What other tests do I need?  What are my next steps? Summary  The oral glucose tolerance test (OGTT) is one of several tests used to diagnose diabetes that develops during pregnancy (gestational diabetes mellitus). Gestational diabetes is a short-term form of diabetes that some women develop while they are pregnant.  You may have the OGTT test after having a 1-hour glucose screening test if the results from that test show that you may have gestational diabetes. You may also have this test if you have any symptoms or risk factors for this type of diabetes.  Talk with your health care provider about what your results mean. This information is not intended to replace advice given to you by your health care provider. Make sure you discuss any questions you have with your health care provider. Document Revised: 02/15/2020 Document Reviewed: 02/15/2020 Elsevier Patient Education  2021 [employer].

## 2021-01-02 NOTE — Progress Notes (Signed)
   PRENATAL VISIT NOTE  Subjective:  Abigail Morrow is a 25 y.o. G2P1001 at [redacted]w[redacted]d being seen today for ongoing prenatal care.  She is currently monitored for the following issues for this low-risk pregnancy and has Palpitations; Supervision of high risk pregnancy, antepartum; Low-lying placenta; and Hemorrhoids during pregnancy in second trimester on their problem list.  Patient reports no complaints. She had a waterbirth at [place] 2 years ago; desires WB with this pregnancy.  Contractions: Not present. Vag. Bleeding: None.  Movement: Present. Denies leaking of fluid.   The following portions of the patient's history were reviewed and updated as appropriate: allergies, current medications, past family history, past medical history, past social history, past surgical history and problem list.   Objective:   Vitals:   01/02/21 0954  BP: 105/72  Pulse: 70  Weight: 162 lb 9.6 oz (73.8 kg)    Fetal Status: Fetal Heart Rate (bpm): 143 Fundal Height: 25 cm Movement: Present     General:  Alert, oriented and cooperative. Patient is in no acute distress.  Skin: Skin is warm and dry. No rash noted.   Cardiovascular: Normal heart rate noted  Respiratory: Normal respiratory effort, no problems with respiration noted  Abdomen: Soft, gravid, appropriate for gestational age.  Pain/Pressure: Absent     Pelvic: Cervical exam deferred        Extremities: Normal range of motion.  Edema: None  Mental Status: Normal mood and affect. Normal behavior. Normal judgment and thought content.   Assessment and Plan:  Pregnancy: G2P1001 at [redacted]w[redacted]d  1. Supervision of high risk pregnancy, antepartum   2. [redacted] weeks gestation of pregnancy   -Keep [place] appt on the 4/28 -Take WB class and bring certificate -patient knows that Low lying placenta may be contraindication to WB, depending on how close to cervix. Explained that placenta usually moves during pregnancy and will be reevaluated -instructions for 2 hour GTT  given, patient will schedule morning appt  Preterm labor symptoms and general obstetric precautions including but not limited to vaginal bleeding, contractions, leaking of fluid and fetal movement were reviewed in detail with the patient. Please refer to After Visit Summary for other counseling recommendations.   Return in about 3 weeks (around 01/23/2021), or 2 hour GTT and appt with Midwife.  Future Appointments  Date Time Provider Department Center  01/16/2021  8:45 AM [facility] US4 [facility]    Marylene Land, [place]

## 2021-01-16 ENCOUNTER — Ambulatory Visit: Payer: Medicaid Other | Attending: Obstetrics and Gynecology

## 2021-01-16 ENCOUNTER — Other Ambulatory Visit: Payer: Self-pay

## 2021-01-16 DIAGNOSIS — O4402 Placenta previa specified as without hemorrhage, second trimester: Secondary | ICD-10-CM | POA: Diagnosis not present

## 2021-01-16 DIAGNOSIS — O359XX0 Maternal care for (suspected) fetal abnormality and damage, unspecified, not applicable or unspecified: Secondary | ICD-10-CM | POA: Diagnosis not present

## 2021-01-16 DIAGNOSIS — Z3A27 27 weeks gestation of pregnancy: Secondary | ICD-10-CM

## 2021-01-17 ENCOUNTER — Other Ambulatory Visit: Payer: Self-pay | Admitting: *Deleted

## 2021-01-17 DIAGNOSIS — Z362 Encounter for other antenatal screening follow-up: Secondary | ICD-10-CM

## 2021-01-22 ENCOUNTER — Other Ambulatory Visit: Payer: Self-pay | Admitting: *Deleted

## 2021-01-22 ENCOUNTER — Other Ambulatory Visit: Payer: Medicaid Other

## 2021-01-22 ENCOUNTER — Ambulatory Visit (INDEPENDENT_AMBULATORY_CARE_PROVIDER_SITE_OTHER): Payer: Medicaid Other | Admitting: Certified Nurse Midwife

## 2021-01-22 ENCOUNTER — Other Ambulatory Visit: Payer: Self-pay

## 2021-01-22 VITALS — BP 103/66 | HR 69 | Wt 163.9 lb

## 2021-01-22 DIAGNOSIS — O2243 Hemorrhoids in pregnancy, third trimester: Secondary | ICD-10-CM

## 2021-01-22 DIAGNOSIS — Z3A28 28 weeks gestation of pregnancy: Secondary | ICD-10-CM

## 2021-01-22 DIAGNOSIS — O0990 Supervision of high risk pregnancy, unspecified, unspecified trimester: Secondary | ICD-10-CM

## 2021-01-22 DIAGNOSIS — Z3493 Encounter for supervision of normal pregnancy, unspecified, third trimester: Secondary | ICD-10-CM

## 2021-01-23 LAB — CBC
Hematocrit: 36.4 % (ref 34.0–46.6)
Hemoglobin: 12.6 g/dL (ref 11.1–15.9)
MCH: 31.9 pg (ref 26.6–33.0)
MCHC: 34.6 g/dL (ref 31.5–35.7)
MCV: 92 fL (ref 79–97)
Platelets: 208 10*3/uL (ref 150–450)
RBC: 3.95 x10E6/uL (ref 3.77–5.28)
RDW: 11.9 % (ref 11.7–15.4)
WBC: 9 10*3/uL (ref 3.4–10.8)

## 2021-01-23 LAB — GLUCOSE TOLERANCE, 2 HOURS W/ 1HR
Glucose, 1 hour: 111 mg/dL (ref 65–179)
Glucose, 2 hour: 87 mg/dL (ref 65–152)
Glucose, Fasting: 78 mg/dL (ref 65–91)

## 2021-01-23 LAB — HIV ANTIBODY (ROUTINE TESTING W REFLEX): HIV Screen 4th Generation wRfx: NONREACTIVE

## 2021-01-23 LAB — RPR: RPR Ser Ql: NONREACTIVE

## 2021-01-24 NOTE — Progress Notes (Signed)
   PRENATAL VISIT NOTE  Subjective:  Abigail Morrow is a 25 y.o. G2P1001 at [redacted]w[redacted]d being seen today for ongoing prenatal care.  She is currently monitored for the following issues for this low-risk pregnancy and has Palpitations; Supervision of high risk pregnancy, antepartum; Low-lying placenta; and Hemorrhoids during pregnancy in second trimester on their problem list.  Patient reports hemorrhoids. Had during first pregnancy, never fully resolved and are getting worse. Has tried hemorrhoid cream, witch hazel and sitz baths with no relief.  Contractions: Irritability. Vag. Bleeding: None.  Movement: Present. Denies leaking of fluid.   The following portions of the patient's history were reviewed and updated as appropriate: allergies, current medications, past family history, past medical history, past social history, past surgical history and problem list.   Objective:   Vitals:   01/22/21 0845  BP: 103/66  Pulse: 69  Weight: 163 lb 14.4 oz (74.3 kg)    Fetal Status: Fetal Heart Rate (bpm): 152 Fundal Height: 28 cm Movement: Present     General:  Alert, oriented and cooperative. Patient is in no acute distress.  Skin: Skin is warm and dry. No rash noted.   Cardiovascular: Normal heart rate noted  Respiratory: Normal respiratory effort, no problems with respiration noted  Abdomen: Soft, gravid, appropriate for gestational age.  Pain/Pressure: Absent     Pelvic: Cervical exam deferred        Extremities: Normal range of motion.  Edema: None  Mental Status: Normal mood and affect. Normal behavior. Normal judgment and thought content.   Assessment and Plan:  Pregnancy: G2P1001 at [redacted]w[redacted]d 1. Encounter for supervision of low-risk pregnancy in third trimester - Doing well, feeling vigorous fetal movement - Consented for waterbirth, only risk factor was low-lying placenta which has resolved as of last U/S  2. [redacted] weeks gestation of pregnancy - Routine OB care including GTT and 3rd trimester  labs  3. Hemorrhoids during pregnancy, antepartum, third trimester - Advised to try an epsom salt bath, explained how to reduce soft hemorrhoids and then apply Tucks pads for astringent purposes. Will refer to pelvic floor physical therapy. - Ambulatory referral to Physical Therapy  Preterm labor symptoms and general obstetric precautions including but not limited to vaginal bleeding, contractions, leaking of fluid and fetal movement were reviewed in detail with the patient. Please refer to After Visit Summary for other counseling recommendations.   Return in about 2 weeks (around 02/05/2021) for LOB.  Future Appointments  Date Time Provider Department Center  02/04/2021 10:35 AM Currie Paris, NP [facility]  02/27/2021  9:00 AM [facility] NURSE [facility]  02/27/2021  9:15 AM [facility] US2 [facility]  03/19/2021 10:15 AM Theressa Millard, PT [facility]    Bernerd Limbo, CNM

## 2021-02-04 ENCOUNTER — Encounter: Payer: Medicaid Other | Admitting: Nurse Practitioner

## 2021-02-20 ENCOUNTER — Other Ambulatory Visit: Payer: Self-pay

## 2021-02-20 ENCOUNTER — Ambulatory Visit (INDEPENDENT_AMBULATORY_CARE_PROVIDER_SITE_OTHER): Payer: Medicaid Other | Admitting: Obstetrics and Gynecology

## 2021-02-20 VITALS — BP 117/73 | HR 84 | Wt 165.8 lb

## 2021-02-20 DIAGNOSIS — O283 Abnormal ultrasonic finding on antenatal screening of mother: Secondary | ICD-10-CM

## 2021-02-20 DIAGNOSIS — Z3A32 32 weeks gestation of pregnancy: Secondary | ICD-10-CM

## 2021-02-20 DIAGNOSIS — O0990 Supervision of high risk pregnancy, unspecified, unspecified trimester: Secondary | ICD-10-CM

## 2021-02-20 NOTE — Progress Notes (Signed)
    PRENATAL VISIT NOTE  Subjective:  Abigail Morrow is a 25 y.o. G2P1001 at [redacted]w[redacted]d being seen today for ongoing prenatal care.  She is currently monitored for the following issues for this high-risk pregnancy and has Palpitations; Supervision of high risk pregnancy, antepartum; Hemorrhoids during pregnancy in second trimester; and Abnormal fetal ultrasound on their problem list.  Patient reports no complaints.  Contractions: Irritability. Vag. Bleeding: None.  Movement: Present. Denies leaking of fluid.   The following portions of the patient's history were reviewed and updated as appropriate: allergies, current medications, past family history, past medical history, past social history, past surgical history and problem list.   Objective:   Vitals:   02/20/21 1605  BP: 117/73  Pulse: 84  Weight: 165 lb 12.8 oz (75.2 kg)    Fetal Status: Fetal Heart Rate (bpm): 147   Movement: Present     General:  Alert, oriented and cooperative. Patient is in no acute distress.  Skin: Skin is warm and dry. No rash noted.   Cardiovascular: Normal heart rate noted  Respiratory: Normal respiratory effort, no problems with respiration noted  Abdomen: Soft, gravid, appropriate for gestational age.  Pain/Pressure: Present     Pelvic: Cervical exam deferred        Extremities: Normal range of motion.  Edema: None  Mental Status: Normal mood and affect. Normal behavior. Normal judgment and thought content.   Assessment and Plan:  Pregnancy: G2P1001 at [redacted]w[redacted]d 1. Abnormal fetal ultrasound Fetus with slightly enlarged cisterna magna (1.2cm vs <1cm considered normal). Pt has repeat u/s on 6/9. efw 1220gm, 85%, ac 90%, afi normal   MFM recommends "baby should be examined after birth to  determine if an enlarged cisterna magna is present"  2. Supervision of high risk pregnancy, antepartum LL placenta resolved. Still planning on water birth  Preterm labor symptoms and general obstetric precautions  including but not limited to vaginal bleeding, contractions, leaking of fluid and fetal movement were reviewed in detail with the patient. Please refer to After Visit Summary for other counseling recommendations.   Return in about 2 weeks (around 03/06/2021) for low risk ob, in person, with a midwife.  Future Appointments  Date Time Provider Department Center  02/27/2021  9:00 AM [facility]URSE [facility]  02/27/2021  9:15 AM [facility] US2 [facility]  03/13/2021  2:15 PM Marylene Land, CNM [facility]  03/19/2021 10:15 AM Theressa Millard, PT [facility]    Urbank Bing, MD

## 2021-02-27 ENCOUNTER — Encounter: Payer: Self-pay | Admitting: *Deleted

## 2021-02-27 ENCOUNTER — Ambulatory Visit: Payer: Medicaid Other | Attending: Obstetrics

## 2021-02-27 ENCOUNTER — Ambulatory Visit: Payer: Medicaid Other | Admitting: *Deleted

## 2021-02-27 ENCOUNTER — Other Ambulatory Visit: Payer: Self-pay

## 2021-02-27 ENCOUNTER — Other Ambulatory Visit: Payer: Self-pay | Admitting: *Deleted

## 2021-02-27 VITALS — BP 113/60 | HR 74

## 2021-02-27 DIAGNOSIS — O359XX0 Maternal care for (suspected) fetal abnormality and damage, unspecified, not applicable or unspecified: Secondary | ICD-10-CM | POA: Diagnosis not present

## 2021-02-27 DIAGNOSIS — O0990 Supervision of high risk pregnancy, unspecified, unspecified trimester: Secondary | ICD-10-CM

## 2021-02-27 DIAGNOSIS — Z3A33 33 weeks gestation of pregnancy: Secondary | ICD-10-CM | POA: Diagnosis not present

## 2021-02-27 DIAGNOSIS — Z362 Encounter for other antenatal screening follow-up: Secondary | ICD-10-CM | POA: Insufficient documentation

## 2021-02-27 DIAGNOSIS — O322XX0 Maternal care for transverse and oblique lie, not applicable or unspecified: Secondary | ICD-10-CM

## 2021-02-27 DIAGNOSIS — O283 Abnormal ultrasonic finding on antenatal screening of mother: Secondary | ICD-10-CM

## 2021-03-13 ENCOUNTER — Encounter: Payer: Self-pay | Admitting: Physical Therapy

## 2021-03-13 ENCOUNTER — Ambulatory Visit: Payer: Medicaid Other | Attending: Certified Nurse Midwife | Admitting: Physical Therapy

## 2021-03-13 ENCOUNTER — Other Ambulatory Visit: Payer: Self-pay

## 2021-03-13 ENCOUNTER — Ambulatory Visit (INDEPENDENT_AMBULATORY_CARE_PROVIDER_SITE_OTHER): Payer: Medicaid Other | Admitting: Student

## 2021-03-13 VITALS — BP 111/70 | HR 92

## 2021-03-13 DIAGNOSIS — R278 Other lack of coordination: Secondary | ICD-10-CM | POA: Insufficient documentation

## 2021-03-13 DIAGNOSIS — O0990 Supervision of high risk pregnancy, unspecified, unspecified trimester: Secondary | ICD-10-CM

## 2021-03-13 DIAGNOSIS — R252 Cramp and spasm: Secondary | ICD-10-CM | POA: Diagnosis not present

## 2021-03-13 DIAGNOSIS — Z3A35 35 weeks gestation of pregnancy: Secondary | ICD-10-CM

## 2021-03-13 NOTE — Progress Notes (Signed)
Patient states that she has daily pressure on pelvis, otherwise feeling fine

## 2021-03-13 NOTE — Progress Notes (Signed)
   PRENATAL VISIT NOTE  Subjective:  Abigail Morrow is a 25 y.o. G2P1001 at [redacted]w[redacted]d being seen today for ongoing prenatal care.  She is currently monitored for the following issues for this low-risk pregnancy and has Palpitations; Supervision of high risk pregnancy, antepartum; Hemorrhoids during pregnancy in second trimester; and Abnormal fetal ultrasound on their problem list.  Patient reports no complaints.  Contractions: Irritability. Vag. Bleeding: None.  Movement: Present. Denies leaking of fluid.   The following portions of the patient's history were reviewed and updated as appropriate: allergies, current medications, past family history, past medical history, past social history, past surgical history and problem list.   Objective:   Vitals:   03/13/21 1435  BP: 111/70  Pulse: 92    Fetal Status: Fetal Heart Rate (bpm): 122 Fundal Height: 35 cm Movement: Present     General:  Alert, oriented and cooperative. Patient is in no acute distress.  Skin: Skin is warm and dry. No rash noted.   Cardiovascular: Normal heart rate noted  Respiratory: Normal respiratory effort, no problems with respiration noted  Abdomen: Soft, gravid, appropriate for gestational age.  Pain/Pressure: Present     Pelvic: Cervical exam deferred        Extremities: Normal range of motion.  Edema: None  Mental Status: Normal mood and affect. Normal behavior. Normal judgment and thought content.   Assessment and Plan:  Pregnancy: G2P1001 at [redacted]w[redacted]d 1. Supervision of high risk pregnancy, antepartum -patient doing well, discussed visitor policy -patient is interested in leaving before 24 hours at the hospital. Recommended that she talk to her pediatrician ahead of time to arrange this; depends on baby's status. -declined TDap -anticipatory guidance for 36 week visit -patient had WB at [facility]; reviewed WB certificate from class she took on 01/22/2021  Preterm labor symptoms and general obstetric precautions  including but not limited to vaginal bleeding, contractions, leaking of fluid and fetal movement were reviewed in detail with the patient. Please refer to After Visit Summary for other counseling recommendations.   Return in about 1 week (around 03/20/2021), or 1 wk LROB with CNM if possible.  Future Appointments  Date Time Provider Department Center  03/19/2021 10:15 AM Theressa Millard, PT [facility]  03/20/2021  9:15 AM Bernerd Limbo, CNM [facility]  03/27/2021  1:30 PM [facility]URSE [facility]  03/27/2021  1:45 PM [facility] US5 [facility]  03/31/2021  4:15 PM Theressa Millard, PT [facility]    Charlesetta Garibaldi [place], [place]

## 2021-03-13 NOTE — Therapy (Signed)
[facility] [address], [address] [place], [place], [address] Phone: [phone]   Fax:  [phone]  Physical Therapy Evaluation  Patient Details  Name: Abigail Morrow MRN: [id number] Date of Birth: 06/23/1996 Referring Provider (PT): Dr. Edd Arbour   Encounter Date: 03/13/2021   PT End of Session - 03/13/21 1208     Visit Number 1    Date for PT Re-Evaluation 04/10/21    Authorization Type U[employer] Medicaid    Authorization Time Period 1    Authorization - Visit Number 27    PT Start Time 1145    PT Stop Time 1225    PT Time Calculation (min) 40 min    Activity Tolerance Patient tolerated treatment well;No increased pain    Behavior During Therapy WFL for tasks assessed/performed             Past Medical History:  Diagnosis Date   Migraine headache with aura    Palpitations    UTI (urinary tract infection)     Past Surgical History:  Procedure Laterality Date   WISDOM TOOTH EXTRACTION  2015    There were no vitals filed for this visit.    Subjective Assessment - 03/13/21 1148     Subjective Patient has a 25 year old and since then had hemorroids. Her current pregnancy is having worse hemorroids. The hemorroid is protruding outside the anus. When it stays out, it will be aggravated on the underwear. Sometimes has to strain to have a bowel movement. Patient is having pressure with this pregnancy. Patient is emptying her stool fully. Have BM 1 time per day. BM are type 4. Will have take a shower after a BM and then still has fecal smearing. Due 04/16/2021.    Patient Stated Goals not deal with Hemorroids    Currently in Pain? No/denies                [facility] PT Assessment - 03/13/21 0001       Assessment   Medical Diagnosis O22.43 Hemorrhoids during pregnancy, antepartum, third pregnancy    Referring Provider (PT) Dr. Edd Arbour    Onset Date/Surgical Date --   12/25/2018   Prior Therapy none       Precautions   Precautions Other (comment)    Precaution Comments pregnant   due 04/16/2021     Restrictions   Weight Bearing Restrictions No      Balance Screen   Has the patient fallen in the past 6 months No    Has the patient had a decrease in activity level because of a fear of falling?  No    Is the patient reluctant to leave their home because of a fear of falling?  No      Prior Function   Level of Independence Independent    Leisure prior to pregnancy did pole dancing      Cognition   Overall Cognitive Status Within Functional Limits for tasks assessed      Posture/Postural Control   Posture/Postural Control Postural limitations    Posture Comments pregnancy posture      ROM / Strength   AROM / PROM / Strength PROM;AROM;Strength      AROM   Overall AROM Comments lumbar ROM is full      PROM   Right Hip Internal Rotation  20    Left Hip Internal Rotation  20      Strength   Right Hip ABduction 4/5  Left Hip ABduction 4/5      Palpation   SI assessment  ASIS are equal                        Objective measurements completed on examination: See above findings.     Pelvic Floor Special Questions - 03/13/21 0001     Prior Pregnancies Yes    Number of Vaginal Deliveries 1    Currently Sexually Active Yes    Is this Painful No    Urinary Leakage Yes    Activities that cause leaking Sneezing    Fecal incontinence Yes    Skin Integrity Hemorroids   around the anus   External Palpation tightness in the anterior half of the anal sphincter; tightness in the perineal body;    Pelvic Floor Internal Exam Patient confirms identification and approves PT to assess and treat the pelvic floor    Exam Type Rectal   only placed the therapist index finger in 3/4 of an inch   Palpation felt the hemorroids in the anal canal                      PT Education - 03/13/21 1406     Education Details educated patient on massaging the anus to relax  the muscles for a bowel movement; educated patient on how to breath for a bowel movement    Person(s) Educated Patient    Methods Explanation    Comprehension Verbalized understanding;Returned demonstration                 PT Long Term Goals - 03/13/21 1231       PT LONG TERM GOAL #1   Title understand how to breath correctly to relax the pelvic floor during a bowel movement to reduce the strain on the hemorroids and correct toileting technique    Baseline not educated yet    Time 4    Period Weeks    Status New    Target Date 04/10/21      PT LONG TERM GOAL #2   Title understand how to beath correctlly as she is giving birth to relax the pelvic floor and not put a strain on her Hemorroids    Time 4    Period Weeks    Status New    Target Date 04/10/21      PT LONG TERM GOAL #3   Title understand different positions to give birth to assist the progression of the baby going through the canal    Baseline not educated yet    Time 4    Period Weeks    Status New    Target Date 04/10/21      PT LONG TERM GOAL #4   Title education on perineal aftercare to reduce hemorroids    Baseline not educated yet    Time 4    Period Weeks    Status New    Target Date 04/10/21                    Plan - 03/13/21 1410     Clinical Impression Statement Patient is a 25 year old female with Hemorroids that are aggravated with her current pregnancy. The Hemorroids started during the birth of her first child 2 years ago. She does not feel like she fully empties her bowels and has a pressure feeling in the pelvic floor. She has tightness in the anterior half  of the External anal sphincter and perineal body. Hemorroids are felt internally and present externally. Therapist index finger was inserted 3/4 of an inch when she felt the internal hemorroids. Patient was not able to relax the anal shincter with breathing out initially but after give the proper verbal cues she was able to  pushe the therapist finger out of the canal. Patient will benefit from skilled therapy to improve toileting and ways to push her baby out to reduce the hemorroids.    Personal Factors and Comorbidities Comorbidity 1    Comorbidities presently pregnant and due on 04/16/2021    Examination-Activity Limitations Toileting    Stability/Clinical Decision Making Stable/Uncomplicated    Clinical Decision Making Low    Rehab Potential Excellent    PT Frequency 1x / week    PT Duration 4 weeks    PT Treatment/Interventions ADLs/Self Care Home Management;Biofeedback;Therapeutic activities;Therapeutic exercise;Neuromuscular re-education;Patient/family education;Manual techniques    PT Next Visit Plan toileting technique, manual work to the anal area for restrictions, different ways to labor and move the baby through the birthing canal    Consulted and Agree with Plan of Care Patient             Patient will benefit from skilled therapeutic intervention in order to improve the following deficits and impairments:  Decreased coordination, Increased muscle spasms, Decreased strength, Decreased activity tolerance  Visit Diagnosis: Cramp and spasm - Plan: PT plan of care cert/re-cert  Other lack of coordination - Plan: PT plan of care cert/re-cert     Problem List Patient Active Problem List   Diagnosis Date Noted   Abnormal fetal ultrasound 02/20/2021   Hemorrhoids during pregnancy in second trimester 12/03/2020   Supervision of high risk pregnancy, antepartum 10/31/2020   Palpitations 07/07/2013    Eulis Foster, PT 03/13/21 2:30 PM  [facility] [address] [place], [place], [address] Phone: [phone]   Fax:  [phone]  Name: Danijah Noh MRN: [id number] Date of Birth: [birth year]

## 2021-03-19 ENCOUNTER — Other Ambulatory Visit: Payer: Self-pay

## 2021-03-19 ENCOUNTER — Ambulatory Visit: Payer: Medicaid Other | Admitting: Physical Therapy

## 2021-03-19 ENCOUNTER — Encounter: Payer: Self-pay | Admitting: Physical Therapy

## 2021-03-19 DIAGNOSIS — R252 Cramp and spasm: Secondary | ICD-10-CM | POA: Diagnosis not present

## 2021-03-19 DIAGNOSIS — R278 Other lack of coordination: Secondary | ICD-10-CM

## 2021-03-19 NOTE — Therapy (Signed)
[facility] [address] [place], [place], [address] Phone: [phone]   Fax:  [phone]  Physical Therapy Treatment  Patient Details  Name: Abigail Morrow MRN: [id number] Date of Birth: July 25, 1996 Referring Provider (PT): Dr. Edd Arbour   Encounter Date: 03/19/2021   PT End of Session - 03/19/21 1021     Visit Number 2    Date for PT Re-Evaluation 04/10/21    Authorization Type UHC Medicaid    Authorization Time Period 2    Authorization - Visit Number 27    PT Start Time 1015    PT Stop Time 1055    PT Time Calculation (min) 40 min    Activity Tolerance Patient tolerated treatment well;No increased pain    Behavior During Therapy WFL for tasks assessed/performed             Past Medical History:  Diagnosis Date   Migraine headache with aura    Palpitations    UTI (urinary tract infection)     Past Surgical History:  Procedure Laterality Date   WISDOM TOOTH EXTRACTION  2015    There were no vitals filed for this visit.   Subjective Assessment - 03/19/21 1020     Subjective I have had less issues since the last visit. I am focusing on relaxing my body. They are not protruding and having esier stools. I am not straining as much. I feel like I am emptying my bowels better.    Patient Stated Goals not deal with Hemorroids    Currently in Pain? No/denies                            Pelvic Floor Special Questions - 03/19/21 0001     Pelvic Floor Internal Exam Patient confirms identification and approves PT to assess and treat the pelvic floor    Exam Type Rectal   only placed the therapist index finger in 3/4 of an inch              [facility] Adult PT Treatment/Exercise - 03/19/21 0001       Self-Care   Self-Care Other Self-Care Comments    Other Self-Care Comments  education on perineal care after having the baby      Therapeutic Activites    Therapeutic Activities  Other Therapeutic Activities    Other Therapeutic Activities educated patient on correct toileting technique to relax the pelvic floor withbreath, knees above hips, and breathing out to feel the abdomen tighten and relax the anus; discussed with patient on different birthing positions to have the baby go through the canal      Manual Therapy   Manual Therapy Internal Pelvic Floor;Myofascial release    Manual therapy comments instructed patient on how tomassage around the anal sphincter    Myofascial Release fascial release around the anal sphincter with focus on the anterior rectum    Internal Pelvic Floor one finger in the anal canal and other on the exterior of the anterio anal sphincter releasing the tissu                    PT Education - 03/19/21 1058     Education Details birthing postions; perineal care and anal care after birth; manual work on the anal region to reduce tissue restrictions; breathing for birthing and toiletinf, Correct toileting technique    Person(s) Educated Patient    Methods Explanation;Demonstration;Verbal cues;Handout  Comprehension Returned demonstration;Verbalized understanding                 PT Long Term Goals - 03/13/21 1231       PT LONG TERM GOAL #1   Title understand how to breath correctly to relax the pelvic floor during a bowel movement to reduce the strain on the hemorroids and correct toileting technique    Baseline not educated yet    Time 4    Period Weeks    Status New    Target Date 04/10/21      PT LONG TERM GOAL #2   Title understand how to beath correctlly as she is giving birth to relax the pelvic floor and not put a strain on her Hemorroids    Time 4    Period Weeks    Status New    Target Date 04/10/21      PT LONG TERM GOAL #3   Title understand different positions to give birth to assist the progression of the baby going through the canal    Baseline not educated yet    Time 4    Period Weeks     Status New    Target Date 04/10/21      PT LONG TERM GOAL #4   Title education on perineal aftercare to reduce hemorroids    Baseline not educated yet    Time 4    Period Weeks    Status New    Target Date 04/10/21                   Plan - 03/19/21 1026     Clinical Impression Statement Patient reports it is easier to have a bowel movement and she is not having to strain since the initial evaluation. Patient had increased tissue elongation after the manual work to the anal area and understands how to perfrom at home. Patient understands how to breath to relax the pelvic floor for bowel movements and birthing. Patient understands how to take care of the perineum after birth. Patient will benefit from skilled therapy to improve toileting and ways to push her baby out to reduce the hemorroids.    Personal Factors and Comorbidities Comorbidity 1    Comorbidities presently pregnant and due on 04/16/2021    Examination-Activity Limitations Toileting    Stability/Clinical Decision Making Stable/Uncomplicated    Rehab Potential Excellent    PT Frequency 1x / week    PT Duration 4 weeks    PT Treatment/Interventions ADLs/Self Care Home Management;Biofeedback;Therapeutic activities;Therapeutic exercise;Neuromuscular re-education;Patient/family education;Manual techniques    PT Next Visit Plan different ways to labor and move the baby through the birthing canal; perineal massage    Recommended Other Services MD signed initial eval    Consulted and Agree with Plan of Care Patient             Patient will benefit from skilled therapeutic intervention in order to improve the following deficits and impairments:  Decreased coordination, Increased muscle spasms, Decreased strength, Decreased activity tolerance  Visit Diagnosis: Cramp and spasm  Other lack of coordination     Problem List Patient Active Problem List   Diagnosis Date Noted   Abnormal fetal ultrasound 02/20/2021    Hemorrhoids during pregnancy in second trimester 12/03/2020   Supervision of high risk pregnancy, antepartum 10/31/2020   Palpitations 07/07/2013    Abigail Morrow, PT 03/19/21 11:02 AM   [facility] [address], [address] [place],  [place], [address] Phone: [phone]   Fax:  [phone]  Name: Abigail Morrow MRN: [id number] Date of Birth: Dec 21, 1995

## 2021-03-19 NOTE — Patient Instructions (Addendum)
Toileting Techniques for Bowel Movements    An Evacuation/Defecation Plan   Here are the 4 basic points:  Lean forward enough for your elbows to rest on your knees Support your feet on the floor or use a low stool if your feet don't touch the floor  Push out your belly as if you have swallowed a beach ball--you should feel a widening of your waist. "Belly Big, Belly Hard" Open and relax your pelvic floor muscles, rather than tightening around the anus  While you are sitting on the toilet pay attention to the following areas: Jaw and mouth position- relaxed not clenched Angle of your hips - leaning slightly forward Whether your feet touch the ground or not - should be flat and supported Arm placement - rest against your thighs Spine position - flat back Waist Breathing - exhale as you push (like blowing up a balloon or try using other sounds such as ahhhh, shhhhh, ohhhh or grrrrrrr) Abigail Morrow - hard and tight as you push Anus (opening of the anal canal) - relaxed and open as you push Anus - Tighten and lift pulling the muscle back in after you are done or if taking a break  If you are not successful after 10-15 minutes, try again later.  Avoid negative self-talk about your toileting experience.   Read this for more details and ask your PT if you need suggestions for adjustments or limitations:  Sitting on the toilet  a) Make sure your feet are supported - flat on the floor or step stool b) Many people find it effective to lean forward or raise their knees.  Propping your feet on a step stool (Squatty Potty is a brand name) can help the muscles around the anus to relax  c) When you lean forward, place your forearms on your thighs for support  Relaxing Breathe deeply and slowly in through your nose and out through your mouth. To become aware of how to relax your muscles, contracting and releasing muscles can be helpful.  Pull your pelvic floor muscles in tightly by using the image of  holding back gas, or closing around the anus (visualize making a circle smaller) and lifting the anus up and in.  Then release the muscles and your anus should drop down and feel open. Repeat 5 times ending with the feeling of relaxation. Keep your pelvic floor muscles relaxed; let your belly bulge out. The digestive tract starts at the mouth and ends at the anal opening, so be sure to relax both ends of the tube.  Place your tongue on the roof of your mouth with your teeth separated.  This helps relax your mouth and will help to relax the anus at the same time.  Emptying (defecation) a) Keep your pelvic floor and sphincter relaxed, then bulge your anal muscles.  Make the anal opening wide.  b) Stick your belly out as if you have swallowed a beach ball. c) Make your belly wall hard using your belly muscles while continuing to breathe. Doing this makes it easier to open your anus. d) Breath out and give a grunt (or try using other sounds such as ahhhh, shhhhh, ohhhh or grrrrrrr). e)  Can also try to act as if you are blowing up a balloon as you push  4) Finishing a) As you finish your bowel movement, pull the pelvic floor muscles up and in.  This will leave your anus in the proper place rather than remaining pushed out and down. If  you leave your anus pushed out and down, it will start to feel as though that is normal and give you incorrect signals about needing to have a bowel movement.  Moisturizers They are used in the vagina to hydrate the mucous membrane that make up the vaginal canal. Designed to keep a more normal acid balance (ph) Once placed in the vagina, it will last between two to three days.  Use 2-3 times per week at bedtime  Ingredients to avoid is glycerin and fragrance, can increase chance of infection Should not be used just before sex due to causing irritation Most are gels administered either in a tampon-shaped applicator or as a vaginal suppository. They are  non-hormonal.   Types of Moisturizers(internal use)  Vitamin E vaginal suppositories- Whole foods, Amazon Moist Again Coconut oil- can break down condoms Julva- (Do no use if on Tamoxifen) amazon Yes moisturizer- amazon NeuEve Silk , NeuEve Silver for menopausal or over 65 (if have severe vaginal atrophy or cancer treatments use NeuEve Silk for  1 month than move to [employer])- [employer], [place].com Olive and Bee intimate cream- www.oliveandbee.com.au Mae vaginal moisturizer- Amazon Aloe    Creams to use externally on the Vulva area Marathon Oil (good for for cancer patients that had radiation to the area)- [place] or [employer].https://garcia-valdez.org/ V-magic cream - amazon Julva-amazon Vital "V Wild Yam salve ( help moisturize and help with thinning vulvar area, does have Beeswax MoodMaid Botanical Pro-Meno Wild Yam Cream- Amazon Desert Harvest Gele Cleo by Zane Herald labial moisturizer (Amazon,  Coconut or olive oil aloe   Things to avoid in the vaginal area Do not use things to irritate the vulvar area No lotions just specialized creams for the vulva area- Neogyn, V-magic, No soaps; can use Aveeno or Calendula cleanser if needed. Must be gentle No deodorants No douches Good to sleep without underwear to let the vaginal area to air out No scrubbing: spread the lips to let warm water rinse over labias and pat dry  Padsicles - Postpartum Pads for Mom PADSICLES How to Make Padsicles (postpartum frozen pads) to help soothe and heal after after delivering a baby. Padsicles are the best postpartum pads because they are covered in aloe vera, witch hazel and lavender oil.  Prep Time5 minutes Active Time15 minutes Total Time20 minutes Makes20 padsicles  EQUIPMENT teaspoon  SUPPLIES 20 Extra Heavy Overnight Maxi Pads Aloe Vera Designer, television/film set  INSTRUCTIONS Partially unwrap a few pads at a time, but don't detach the wrapper. Spread aloe vera generously up  and down the whole pad. Don't just do the middle part - spread it further down almost to the bottom of the pad. Pour about a teaspoon of witch hazel down the middle.  Add a few drops of lavender oil, for healing purposes.  Fold the pads back up to how they were and stick them in a gallon sized plastic bag, then freeze. Pull them out of the freezer, one by one, as needed and let them thaw for two or three minutes before use.  Can use the [facility] Reveleum on the Heorrois when they are hurting and after birth.   [facility] [address] [place], [place] [address] Phone # [phone] Fax [phone]

## 2021-03-20 ENCOUNTER — Other Ambulatory Visit (HOSPITAL_COMMUNITY)
Admission: RE | Admit: 2021-03-20 | Discharge: 2021-03-20 | Disposition: A | Discharge status: Home / Self Care | Payer: Medicaid Other | Source: Ambulatory Visit | Attending: Certified Nurse Midwife | Admitting: Certified Nurse Midwife

## 2021-03-20 ENCOUNTER — Encounter: Payer: Self-pay | Admitting: Certified Nurse Midwife

## 2021-03-20 ENCOUNTER — Ambulatory Visit (INDEPENDENT_AMBULATORY_CARE_PROVIDER_SITE_OTHER): Payer: Medicaid Other | Admitting: Certified Nurse Midwife

## 2021-03-20 VITALS — BP 115/72 | HR 101 | Wt 172.9 lb

## 2021-03-20 DIAGNOSIS — Z3493 Encounter for supervision of normal pregnancy, unspecified, third trimester: Secondary | ICD-10-CM | POA: Diagnosis not present

## 2021-03-20 DIAGNOSIS — Z3A36 36 weeks gestation of pregnancy: Secondary | ICD-10-CM | POA: Insufficient documentation

## 2021-03-20 NOTE — Progress Notes (Signed)
   PRENATAL VISIT NOTE  Subjective:  Abigail Morrow is a 25 y.o. G2P1001 at [redacted]w[redacted]d being seen today for ongoing prenatal care.  She is currently monitored for the following issues for this low-risk pregnancy and has Palpitations; Supervision of high risk pregnancy, antepartum; Hemorrhoids during pregnancy in second trimester; and Abnormal fetal ultrasound on their problem list.  Patient reports no complaints.  Contractions: Not present. Vag. Bleeding: None.  Movement: Present. Denies leaking of fluid.   The following portions of the patient's history were reviewed and updated as appropriate: allergies, current medications, past family history, past medical history, past social history, past surgical history and problem list.   Objective:   Vitals:   03/20/21 0936  BP: 115/72  Pulse: (!) 101  Weight: 78.4 kg    Fetal Status: Fetal Heart Rate (bpm): 130 Fundal Height: 36 cm Movement: Present     General:  Alert, oriented and cooperative. Patient is in no acute distress.  Skin: Skin is warm and dry. No rash noted.   Cardiovascular: Normal heart rate noted  Respiratory: Normal respiratory effort, no problems with respiration noted  Abdomen: Soft, gravid, appropriate for gestational age.  Pain/Pressure: Absent     Pelvic: Cervical exam deferred        Extremities: Normal range of motion.  Edema: None  Mental Status: Normal mood and affect. Normal behavior. Normal judgment and thought content.   Assessment and Plan:  Pregnancy: G2P1001 at [redacted]w[redacted]d 1. [redacted] weeks gestation of pregnancy - Abigail Morrow is doing well.  - Culture, beta strep (group b only) - Cervicovaginal ancillary only( [place])  2. Supervision of low-risk pregnancy, third trimester - Went into labor at 41wks with previous pregnancy.  - Planning a waterbirth. Certificate scanned into chart.  - Expecting baby boy with undecided name   Preterm labor symptoms and general obstetric precautions including but not limited to vaginal  bleeding, contractions, leaking of fluid and fetal movement were reviewed in detail with the patient. Please refer to After Visit Summary for other counseling recommendations.   Return in about 1 week (around 03/27/2021) for LROB.  Future Appointments  Date Time Provider Department Center  03/27/2021  1:30 PM [facility] NURSE [facility]  03/27/2021  1:45 PM [facility] US[facility]  03/31/2021  4:15 PM Theressa Millard, PT [facility]    Signed:  Warrick Parisian Danella Deis) Suzie Portela, BSN, RNC-OB  Student Nurse-Midwife   03/20/2021  9:59 AM

## 2021-03-21 LAB — CERVICOVAGINAL ANCILLARY ONLY
Chlamydia: NEGATIVE
Comment: NEGATIVE
Comment: NORMAL
Neisseria Gonorrhea: NEGATIVE

## 2021-03-24 LAB — CULTURE, BETA STREP (GROUP B ONLY): Strep Gp B Culture: NEGATIVE

## 2021-03-27 ENCOUNTER — Ambulatory Visit: Payer: Medicaid Other | Admitting: *Deleted

## 2021-03-27 ENCOUNTER — Other Ambulatory Visit: Payer: Self-pay

## 2021-03-27 ENCOUNTER — Ambulatory Visit: Payer: Medicaid Other | Attending: Maternal & Fetal Medicine

## 2021-03-27 ENCOUNTER — Encounter: Payer: Self-pay | Admitting: *Deleted

## 2021-03-27 ENCOUNTER — Other Ambulatory Visit: Payer: Self-pay | Admitting: Maternal & Fetal Medicine

## 2021-03-27 VITALS — BP 125/94 | HR 78

## 2021-03-27 DIAGNOSIS — O283 Abnormal ultrasonic finding on antenatal screening of mother: Secondary | ICD-10-CM | POA: Insufficient documentation

## 2021-03-27 DIAGNOSIS — O359XX0 Maternal care for (suspected) fetal abnormality and damage, unspecified, not applicable or unspecified: Secondary | ICD-10-CM | POA: Diagnosis not present

## 2021-03-27 DIAGNOSIS — O0990 Supervision of high risk pregnancy, unspecified, unspecified trimester: Secondary | ICD-10-CM

## 2021-03-27 DIAGNOSIS — Z3A37 37 weeks gestation of pregnancy: Secondary | ICD-10-CM | POA: Diagnosis present

## 2021-03-27 DIAGNOSIS — Z362 Encounter for other antenatal screening follow-up: Secondary | ICD-10-CM | POA: Diagnosis not present

## 2021-03-31 ENCOUNTER — Ambulatory Visit (INDEPENDENT_AMBULATORY_CARE_PROVIDER_SITE_OTHER): Payer: Medicaid Other | Admitting: Obstetrics and Gynecology

## 2021-03-31 ENCOUNTER — Ambulatory Visit: Payer: Medicaid Other | Attending: Certified Nurse Midwife | Admitting: Physical Therapy

## 2021-03-31 ENCOUNTER — Other Ambulatory Visit: Payer: Self-pay

## 2021-03-31 ENCOUNTER — Encounter: Payer: Self-pay | Admitting: Physical Therapy

## 2021-03-31 VITALS — BP 113/59 | HR 76 | Wt 173.6 lb

## 2021-03-31 DIAGNOSIS — Z3A37 37 weeks gestation of pregnancy: Secondary | ICD-10-CM | POA: Insufficient documentation

## 2021-03-31 DIAGNOSIS — O0990 Supervision of high risk pregnancy, unspecified, unspecified trimester: Secondary | ICD-10-CM

## 2021-03-31 DIAGNOSIS — R278 Other lack of coordination: Secondary | ICD-10-CM | POA: Diagnosis present

## 2021-03-31 DIAGNOSIS — R252 Cramp and spasm: Secondary | ICD-10-CM | POA: Insufficient documentation

## 2021-03-31 DIAGNOSIS — O283 Abnormal ultrasonic finding on antenatal screening of mother: Secondary | ICD-10-CM

## 2021-03-31 NOTE — Progress Notes (Signed)
   PRENATAL VISIT NOTE  Subjective:  Abigail Morrow is a 25 y.o. G2P1001 at [redacted]w[redacted]d being seen today for ongoing prenatal care.  She is currently monitored for the following issues for this low-risk pregnancy and has Palpitations; Supervision of high risk pregnancy, antepartum; Hemorrhoids during pregnancy in second trimester; Abnormal fetal ultrasound; and [redacted] weeks gestation of pregnancy on their problem list.  Patient doing well with no acute concerns today. She reports no complaints.  Contractions: Irritability. Vag. Bleeding: None.  Movement: Present. Denies leaking of fluid.   The following portions of the patient's history were reviewed and updated as appropriate: allergies, current medications, past family history, past medical history, past social history, past surgical history and problem list. Problem list updated.  Objective:   Vitals:   03/31/21 1439  BP: (!) 113/59  Pulse: 76  Weight: 173 lb 9.6 oz (78.7 kg)    Fetal Status: Fetal Heart Rate (bpm): 128 Fundal Height: 38 cm Movement: Present     General:  Alert, oriented and cooperative. Patient is in no acute distress.  Skin: Skin is warm and dry. No rash noted.   Cardiovascular: Normal heart rate noted  Respiratory: Normal respiratory effort, no problems with respiration noted  Abdomen: Soft, gravid, appropriate for gestational age.  Pain/Pressure: Present     Pelvic: Cervical exam deferred        Extremities: Normal range of motion.  Edema: Trace  Mental Status:  Normal mood and affect. Normal behavior. Normal judgment and thought content.   Assessment and Plan:  Pregnancy: G2P1001 at [redacted]w[redacted]d  1. [redacted] weeks gestation of pregnancy   2. Supervision of high risk pregnancy, antepartum Continue routine care  3. Abnormal fetal ultrasound Last fetal ultrasound noted resolution of enlarged cisterna magnum  Term labor symptoms and general obstetric precautions including but not limited to vaginal bleeding, contractions,  leaking of fluid and fetal movement were reviewed in detail with the patient.  Please refer to After Visit Summary for other counseling recommendations.   Return in about 1 week (around 04/07/2021) for LOB, in person.   Mariel Aloe, MD Faculty Attending [facility]

## 2021-03-31 NOTE — Therapy (Signed)
[facility] [address] [place], [place], [address] Phone: [phone]   Fax:  [phone]  Physical Therapy Treatment  Patient Details  Name: Abigail Morrow MRN: [id number] Date of Birth: 02/10/1996 Referring Provider (PT): Dr. Gaylan Gerold   Encounter Date: 03/31/2021   PT End of Session - 03/31/21 1703     Visit Number 3    Date for PT Re-Evaluation 04/10/21    Authorization Type [employer] Medicaid    Authorization Time Period 3    Authorization - Visit Number 27    PT Start Time 4270    PT Stop Time 1655    PT Time Calculation (min) 40 min    Activity Tolerance Patient tolerated treatment well;No increased pain    Behavior During Therapy WFL for tasks assessed/performed             Past Medical History:  Diagnosis Date   Migraine headache with aura    Palpitations    UTI (urinary tract infection)     Past Surgical History:  Procedure Laterality Date   WISDOM TOOTH EXTRACTION  2015    There were no vitals filed for this visit.   Subjective Assessment - 03/31/21 1620     Subjective I am getting some contractions. If do not eat rigth the internal hemorroids will prolapse. The information given has helped with the hemorroids.    Patient Stated Goals not deal with Hemorroids    Currently in Pain? No/denies                [facility] PT Assessment - 03/31/21 0001       Assessment   Medical Diagnosis O22.43 Hemorrhoids during pregnancy, antepartum, third pregnancy    Referring Provider (PT) Dr. Gaylan Gerold    Onset Date/Surgical Date --   12/25/2018   Prior Therapy none      Precautions   Precautions Other (comment)    Precaution Comments pregnant   due 04/16/2021     Restrictions   Weight Bearing Restrictions No      Prior Function   Level of Independence Independent    Leisure prior to pregnancy did pole dancing      Cognition   Overall Cognitive Status Within Functional Limits for  tasks assessed      Posture/Postural Control   Posture/Postural Control Postural limitations    Posture Comments pregnancy posture      AROM   Overall AROM Comments lumbar ROM is full      PROM   Right Hip Internal Rotation  20    Left Hip Internal Rotation  20      Strength   Right Hip ABduction 4/5    Left Hip ABduction 4/5                        Pelvic Floor Special Questions - 03/31/21 0001     Number of Vaginal Deliveries 1    Currently Sexually Active Yes    Is this Painful No    Urinary Leakage Yes    Activities that cause leaking Sneezing    Fecal incontinence Yes               [facility] Adult PT Treatment/Exercise - 03/31/21 0001       Self-Care   Self-Care Other Self-Care Comments    Other Self-Care Comments  education on perianal care, perianal massage to reduce tearing;education on manaul work to the perineal body after  the tear has healed and educated on the different tears      Therapeutic Activites    Therapeutic Activities Other Therapeutic Activities;ADL's    ADL's engaging the core to roll in bed and lift the baby    Other Therapeutic Activities discussed and showed patient different birthing positions to bring the baby through the canal form different stages in the pelvis; instructed patient on positions on the bed to bring the baby through the canal      Exercises   Exercises Other Exercises    Other Exercises  pelvic floor contraction holding for 10 seconds 10x ; breathing with abdominal contraction for bracing 10 times in supine                    PT Education - 03/31/21 1702     Education Details education on birthing positions for bringing the baby through the stages; perineal massage to reduce risk of tearing; scar massage to the perineal area if she tears; how to contract the pelvic floor and breathing    Person(s) Educated Patient    Methods Explanation;Handout;Demonstration    Comprehension Verbalized  understanding;Returned demonstration                 PT Long Term Goals - 03/31/21 1706       PT LONG TERM GOAL #1   Title understand how to breath correctly to relax the pelvic floor during a bowel movement to reduce the strain on the hemorroids and correct toileting technique    Time 4    Period Weeks    Status Achieved      PT LONG TERM GOAL #2   Title understand how to beath correctlly as she is giving birth to relax the pelvic floor and not put a strain on her Hemorroids    Time 4    Period Weeks    Status Achieved      PT LONG TERM GOAL #3   Title understand different positions to give birth to assist the progression of the baby going through the canal    Time 4    Period Weeks    Status Achieved      PT LONG TERM GOAL #4   Title education on perineal aftercare to reduce hemorroids    Time 4    Period Weeks    Status Achieved                   Plan - 03/31/21 1703     Clinical Impression Statement Patient understands different birthing positions with bringing the baby through the canal. Educated on pernineal massage for prior to birth and after if she tears. Understand how to engage the pelvic floor and abdominals after birth to support her trunk. Patient understands how to take care of her hemorroids to manage them prior and after birth. Patient is able to have a bowel movement without straining. Patient is ready for discharge.    Personal Factors and Comorbidities Comorbidity 1    Comorbidities presently pregnant and due on 04/16/2021    Examination-Activity Limitations Toileting    Stability/Clinical Decision Making Stable/Uncomplicated    Rehab Potential Excellent    PT Frequency 1x / week    PT Duration 4 weeks    PT Treatment/Interventions ADLs/Self Care Home Management;Biofeedback;Therapeutic activities;Therapeutic exercise;Neuromuscular re-education;Patient/family education;Manual techniques    PT Next Visit Plan Discharge to HEP    Consulted  and Agree with Plan of Care Patient  Patient will benefit from skilled therapeutic intervention in order to improve the following deficits and impairments:  Decreased coordination, Increased muscle spasms, Decreased strength, Decreased activity tolerance  Visit Diagnosis: Cramp and spasm  Other lack of coordination     Problem List Patient Active Problem List   Diagnosis Date Noted   [redacted] weeks gestation of pregnancy 03/31/2021   Abnormal fetal ultrasound 02/20/2021   Hemorrhoids during pregnancy in second trimester 12/03/2020   Supervision of high risk pregnancy, antepartum 10/31/2020   Palpitations 07/07/2013    Earlie Counts, PT 03/31/21 5:08 PM   [facility] [address] [place], [place], [address] Phone: [phone]   Fax:  [phone]  Name: Abigail Morrow MRN: [id number] Date of Birth: Sep 21, 1996 PHYSICAL THERAPY DISCHARGE SUMMARY  Visits from Start of Care: 3  Current functional level related to goals / functional outcomes: See above.    Remaining deficits: See above.    Education / Equipment: HEP   Patient agrees to discharge. Patient goals were met. Patient is being discharged due to meeting the stated rehab goals.Thank you for the referral. Earlie Counts, PT 03/31/21 5:08 PM

## 2021-04-14 ENCOUNTER — Other Ambulatory Visit: Payer: Self-pay

## 2021-04-14 ENCOUNTER — Encounter (HOSPITAL_COMMUNITY): Payer: Self-pay | Admitting: Obstetrics & Gynecology

## 2021-04-14 ENCOUNTER — Inpatient Hospital Stay (HOSPITAL_COMMUNITY)
Admission: AD | Admit: 2021-04-14 | Discharge: 2021-04-14 | Disposition: A | Discharge status: Home / Self Care | Payer: Medicaid Other | Attending: Obstetrics & Gynecology | Admitting: Obstetrics & Gynecology

## 2021-04-14 DIAGNOSIS — Z3A39 39 weeks gestation of pregnancy: Secondary | ICD-10-CM | POA: Insufficient documentation

## 2021-04-14 DIAGNOSIS — O479 False labor, unspecified: Secondary | ICD-10-CM

## 2021-04-14 DIAGNOSIS — O471 False labor at or after 37 completed weeks of gestation: Secondary | ICD-10-CM | POA: Insufficient documentation

## 2021-04-14 DIAGNOSIS — Z20822 Contact with and (suspected) exposure to covid-19: Secondary | ICD-10-CM | POA: Insufficient documentation

## 2021-04-14 LAB — RESP PANEL BY RT-PCR (FLU A&B, COVID) ARPGX2
Influenza A by PCR: NEGATIVE
Influenza B by PCR: NEGATIVE
SARS Coronavirus 2 by RT PCR: NEGATIVE

## 2021-04-14 NOTE — MAU Note (Signed)
Pt reports ctx started 1100 04/13/2021, have gotten stronger and closer, now 3-5 min. Denies VB, LOF and endorses +FM.

## 2021-04-14 NOTE — MAU Note (Signed)
Pt voiced understanding of contractions without cervical change. Discussed comfort measures for home and labor precautions. Questions answered.

## 2021-04-16 ENCOUNTER — Inpatient Hospital Stay (HOSPITAL_COMMUNITY): Admit: 2021-04-16 | Payer: Self-pay

## 2021-04-16 ENCOUNTER — Other Ambulatory Visit: Payer: Self-pay

## 2021-04-16 ENCOUNTER — Encounter (HOSPITAL_COMMUNITY): Payer: Self-pay | Admitting: Obstetrics & Gynecology

## 2021-04-16 ENCOUNTER — Inpatient Hospital Stay (HOSPITAL_COMMUNITY)
Admission: AD | Admit: 2021-04-16 | Discharge: 2021-04-17 | DRG: 807 | Disposition: A | Discharge status: Home / Self Care | Payer: Medicaid Other | Attending: Obstetrics & Gynecology | Admitting: Obstetrics & Gynecology

## 2021-04-16 ENCOUNTER — Encounter: Payer: Medicaid Other | Admitting: Certified Nurse Midwife

## 2021-04-16 DIAGNOSIS — O26893 Other specified pregnancy related conditions, third trimester: Secondary | ICD-10-CM | POA: Diagnosis present

## 2021-04-16 DIAGNOSIS — Z3A40 40 weeks gestation of pregnancy: Secondary | ICD-10-CM

## 2021-04-16 DIAGNOSIS — O4202 Full-term premature rupture of membranes, onset of labor within 24 hours of rupture: Secondary | ICD-10-CM | POA: Diagnosis not present

## 2021-04-16 DIAGNOSIS — O479 False labor, unspecified: Secondary | ICD-10-CM | POA: Diagnosis present

## 2021-04-16 LAB — CBC
HCT: 33.8 % — ABNORMAL LOW (ref 36.0–46.0)
Hemoglobin: 11.5 g/dL — ABNORMAL LOW (ref 12.0–15.0)
MCH: 29.3 pg (ref 26.0–34.0)
MCHC: 34 g/dL (ref 30.0–36.0)
MCV: 86 fL (ref 80.0–100.0)
Platelets: 230 10*3/uL (ref 150–400)
RBC: 3.93 MIL/uL (ref 3.87–5.11)
RDW: 13.2 % (ref 11.5–15.5)
WBC: 15.2 10*3/uL — ABNORMAL HIGH (ref 4.0–10.5)
nRBC: 0 % (ref 0.0–0.2)

## 2021-04-16 LAB — TYPE AND SCREEN
ABO/RH(D): A POS
Antibody Screen: NEGATIVE

## 2021-04-16 LAB — RPR: RPR Ser Ql: NONREACTIVE

## 2021-04-16 MED ORDER — ONDANSETRON HCL 4 MG/2ML IJ SOLN: 4.0000 mg | Freq: Four times a day (QID) | INTRAMUSCULAR | Status: DC | PRN

## 2021-04-16 MED ORDER — ONDANSETRON HCL 4 MG/2ML IJ SOLN: 4.0000 mg | INTRAMUSCULAR | Status: DC | PRN

## 2021-04-16 MED ORDER — ACETAMINOPHEN 325 MG PO TABS: 650.0000 mg | ORAL_TABLET | ORAL | Status: DC | PRN

## 2021-04-16 MED ORDER — OXYTOCIN 10 UNIT/ML IJ SOLN
INTRAMUSCULAR | Status: AC
  Filled 2021-04-16: qty 1

## 2021-04-16 MED ORDER — LIDOCAINE HCL (PF) 1 % IJ SOLN: 30.0000 mL | INTRAMUSCULAR | Status: DC | PRN

## 2021-04-16 MED ORDER — SENNOSIDES-DOCUSATE SODIUM 8.6-50 MG PO TABS
2.0000 | ORAL_TABLET | ORAL | Status: DC
  Administered 2021-04-17: 2 via ORAL
  Filled 2021-04-16: qty 2

## 2021-04-16 MED ORDER — ZOLPIDEM TARTRATE 5 MG PO TABS: 5.0000 mg | ORAL_TABLET | Freq: Every evening | ORAL | Status: DC | PRN

## 2021-04-16 MED ORDER — DIBUCAINE (PERIANAL) 1 % EX OINT: 1.0000 "application " | TOPICAL_OINTMENT | CUTANEOUS | Status: DC | PRN

## 2021-04-16 MED ORDER — OXYCODONE-ACETAMINOPHEN 5-325 MG PO TABS: 2.0000 | ORAL_TABLET | ORAL | Status: DC | PRN

## 2021-04-16 MED ORDER — OXYTOCIN-SODIUM CHLORIDE 30-0.9 UT/500ML-% IV SOLN: 2.5000 [IU]/h | INTRAVENOUS | Status: DC

## 2021-04-16 MED ORDER — WITCH HAZEL-GLYCERIN EX PADS: 1.0000 "application " | MEDICATED_PAD | CUTANEOUS | Status: DC | PRN

## 2021-04-16 MED ORDER — SOD CITRATE-CITRIC ACID 500-334 MG/5ML PO SOLN: 30.0000 mL | ORAL | Status: DC | PRN

## 2021-04-16 MED ORDER — LACTATED RINGERS IV SOLN: INTRAVENOUS | Status: DC

## 2021-04-16 MED ORDER — SIMETHICONE 80 MG PO CHEW: 80.0000 mg | CHEWABLE_TABLET | ORAL | Status: DC | PRN

## 2021-04-16 MED ORDER — IBUPROFEN 600 MG PO TABS
600.0000 mg | ORAL_TABLET | Freq: Four times a day (QID) | ORAL | Status: DC
  Filled 2021-04-16 (×2): qty 1

## 2021-04-16 MED ORDER — PRENATAL MULTIVITAMIN CH
1.0000 | ORAL_TABLET | Freq: Every day | ORAL | Status: DC
  Filled 2021-04-16: qty 1

## 2021-04-16 MED ORDER — OXYCODONE-ACETAMINOPHEN 5-325 MG PO TABS: 1.0000 | ORAL_TABLET | ORAL | Status: DC | PRN

## 2021-04-16 MED ORDER — ONDANSETRON HCL 4 MG PO TABS: 4.0000 mg | ORAL_TABLET | ORAL | Status: DC | PRN

## 2021-04-16 MED ORDER — BENZOCAINE-MENTHOL 20-0.5 % EX AERO
1.0000 | INHALATION_SPRAY | CUTANEOUS | Status: DC | PRN
Start: 2021-04-16 — End: 2021-04-17

## 2021-04-16 MED ORDER — HYDROCORTISONE ACETATE 25 MG RE SUPP
25.0000 mg | Freq: Two times a day (BID) | RECTAL | Status: DC
  Administered 2021-04-16 – 2021-04-17 (×3): 25 mg via RECTAL
  Filled 2021-04-16 (×3): qty 1

## 2021-04-16 MED ORDER — LACTATED RINGERS IV SOLN: 500.0000 mL | INTRAVENOUS | Status: DC | PRN

## 2021-04-16 MED ORDER — DIPHENHYDRAMINE HCL 25 MG PO CAPS: 25.0000 mg | ORAL_CAPSULE | Freq: Four times a day (QID) | ORAL | Status: DC | PRN

## 2021-04-16 MED ORDER — TETANUS-DIPHTH-ACELL PERTUSSIS 5-2.5-18.5 LF-MCG/0.5 IM SUSY
0.5000 mL | PREFILLED_SYRINGE | Freq: Once | INTRAMUSCULAR | Status: DC
Start: 2021-04-17 — End: 2021-04-17

## 2021-04-16 MED ORDER — COCONUT OIL OIL
1.0000 "application " | TOPICAL_OIL | Status: DC | PRN
  Administered 2021-04-17: 1 via TOPICAL

## 2021-04-16 MED ORDER — OXYTOCIN BOLUS FROM INFUSION: 333.0000 mL | Freq: Once | INTRAVENOUS | Status: DC

## 2021-04-16 NOTE — H&P (Signed)
Abigail Morrow is a 25 y.o. female presenting for painful contractions.  States has been laboring for several days.  Was seen her 2 days ago and discharged.  .Pregnancy has been followed at [facility] and has been low risk   She is planning a waterbirth. Delivered her first baby as waterbirth at [facility] [facility].   RN Note: Pt presents to MAU c/o CTX increasing in intensity since 0100 this morning.  Endorses +FM, denies v/b or LOF  OB History     Gravida  2   Para  1   Term  1   Preterm  0   AB  0   Living  1      SAB  0   IAB  0   Ectopic  0   Multiple  0   Live Births  1          Past Medical History:  Diagnosis Date   Migraine headache with aura    Palpitations    UTI (urinary tract infection)    Past Surgical History:  Procedure Laterality Date   WISDOM TOOTH EXTRACTION  2015   Family History: family history is not on file. Social History:  reports that she has never smoked. She has never used smokeless tobacco. She reports previous alcohol use. She reports that she does not use drugs.     Maternal Diabetes: No Genetic Screening: Normal Maternal Ultrasounds/Referrals: Other:Enlarged cisternal magna which later resolved Fetal Ultrasounds or other Referrals:  None Maternal Substance Abuse:  No Significant Maternal Medications:  None Significant Maternal Lab Results:  Group B Strep negative Other Comments:  None  Review of Systems  Constitutional:  Negative for chills and fever.  Respiratory:  Negative for shortness of breath.   Gastrointestinal:  Positive for abdominal pain. Negative for constipation, diarrhea and nausea.  Genitourinary:  Positive for pelvic pain. Negative for dysuria and vaginal bleeding.  Musculoskeletal:  Positive for back pain.  Neurological:  Negative for dizziness and weakness.  Maternal Medical History:  Reason for admission: Contractions.  Nausea.  Contractions: Onset was 3-5 hours ago.   Frequency: regular.   Perceived severity is  moderate.   Fetal activity: Perceived fetal activity is normal.   Prenatal complications: No bleeding, PIH, infection, IUGR, placental abnormality, pre-eclampsia or preterm labor.   Prenatal Complications - Diabetes: none.  Dilation: 6.5 Effacement (%): 80 Station: 0 Exam by:: ADelford Field, RN Blood pressure 127/72, pulse 68, last menstrual period 07/10/2020, unknown if currently breastfeeding. Maternal Exam:  Uterine Assessment: Contraction strength is moderate.  Contraction frequency is regular.  Abdomen: Patient reports no abdominal tenderness. Estimated fetal weight is 7.   Fetal presentation: vertex Introitus: Normal vulva. Normal vagina.  Ferning test: not done.  Nitrazine test: not done. Amniotic fluid character: not assessed. Pelvis: adequate for delivery.   Cervix: Cervix evaluated by digital exam.     Fetal Exam Fetal Monitor Review: Mode: ultrasound.   Baseline rate: 140.  Variability: moderate (6-25 bpm).   Pattern: accelerations present and no decelerations.   Fetal State Assessment: Category I - tracings are normal.  Physical Exam Constitutional:      General: She is not in acute distress.    Appearance: Normal appearance. She is not ill-appearing or toxic-appearing.  HENT:     Head: Normocephalic.  Cardiovascular:     Rate and Rhythm: Normal rate.  Pulmonary:     Effort: Pulmonary effort is normal. No respiratory distress.  Abdominal:     Tenderness: There is no  guarding or rebound.  Genitourinary:    General: Normal vulva.     Comments: Cervix 6cm Musculoskeletal:        General: Normal range of motion.     Cervical back: Normal range of motion.  Skin:    General: Skin is warm and dry.  Neurological:     General: No focal deficit present.     Mental Status: She is alert.    Prenatal labs: ABO, Rh: A/Positive/-- (02/16 0947) Antibody: Negative (02/16 0947) Rubella: 1.12 (02/16 0947) RPR: Non Reactive (05/04 0848)  HBsAg: Negative (02/16 0947)   HIV: Non Reactive (05/04 0848)  GBS: Negative/-- (06/30 1019)   Assessment/Plan: Single IUP at [redacted]w[redacted]d Active Labor  Admit to Labor and Delivery Routine orders Prepare for Quillian Quince 04/16/2021, 2:39 AM

## 2021-04-16 NOTE — MAU Note (Signed)
Pt presents to MAU c/o CTX increasing in intensity since 0100 this morning.  Endorses +FM, denies v/b or LOF

## 2021-04-16 NOTE — Discharge Summary (Addendum)
Postpartum Discharge Summary    Patient Name: Abigail Morrow DOB: 1996-02-21 MRN: [id number]  Date of admission: 04/16/2021 Delivery date:04/16/2021  Delivering provider: Seabron Spates  Date of discharge: 04/17/2021  Admitting diagnosis: Uterine contractions [O47.9] Intrauterine pregnancy: [redacted]w[redacted]d    Secondary diagnosis:  Active Problems:   Uterine contractions  Additional problems: none    Discharge diagnosis: Term Pregnancy Delivered                                              Post partum procedures: None Augmentation: N/A Complications: None  Hospital course: Onset of Labor With Vaginal Delivery      25y.o. yo G2P1001 at 42w0das admitted in Active Labor on 04/16/2021. Patient had an uncomplicated precipitous labor, delivering in the waterbirth tub.  Membrane Rupture Time/Date: 3:49 AM ,04/16/2021   Delivery Method:Vaginal, Spontaneous  Episiotomy: None  Lacerations:  None  Patient had an uncomplicated postpartum course.  She is ambulating, tolerating a regular diet, passing flatus, and urinating well. Patient is discharged home in stable condition on 04/17/21 per her request for early discharge.  Newborn Data: Birth date:04/16/2021  Birth time:4:11 AM  Gender:Female  Living status:Living  Apgars:7 ,9  Weight:4054 g (8lb 15oz)  Magnesium Sulfate received: No BMZ received: No Rhophylac:N/A MMR:No T-DaP:Given prenatally Flu: No Transfusion:No  Physical exam  Vitals:   04/16/21 1121 04/16/21 1522 04/16/21 1930 04/17/21 0508  BP: 110/70 112/68 112/67 105/63  Pulse: 64 68 66 61  Resp: '17 16 18 18  ' Temp: 98.4 F (36.9 C) 98.9 F (37.2 C) 98.9 F (37.2 C) 98.6 F (37 C)  TempSrc: Oral Oral Oral Oral  SpO2:   97% 97%  Height:       General: alert, cooperative, and no distress Lochia: appropriate Uterine Fundus: firm Incision: N/A DVT Evaluation: No evidence of DVT seen on physical exam. Labs: Lab Results  Component Value Date   WBC 15.2 (H) 04/16/2021    HGB 11.5 (L) 04/16/2021   HCT 33.8 (L) 04/16/2021   MCV 86.0 04/16/2021   PLT 230 04/16/2021   CMP Latest Ref Rng & Units 09/18/2020  Glucose 70 - 99 mg/dL 95  BUN 6 - 20 mg/dL 6  Creatinine 0.44 - 1.00 mg/dL 0.53  Sodium 135 - 145 mmol/L 136  Potassium 3.5 - 5.1 mmol/L 3.9  Chloride 98 - 111 mmol/L 107  CO2 22 - 32 mmol/L 23  Calcium 8.9 - 10.3 mg/dL 8.7(L)   EdFlavia Shippercore: Edinburgh Postnatal Depression Scale Screening Tool 04/16/2021  I have been able to laugh and see the funny side of things. 0  I have looked forward with enjoyment to things. 0  I have blamed myself unnecessarily when things went wrong. 0  I have been anxious or worried for no good reason. 0  I have felt scared or panicky for no good reason. 0  Things have been getting on top of me. 1  I have been so unhappy that I have had difficulty sleeping. 0  I have felt sad or miserable. 0  I have been so unhappy that I have been crying. 0  The thought of harming myself has occurred to me. 0  Edinburgh Postnatal Depression Scale Total 1      After visit meds:  Allergies as of 04/17/2021   No Known Allergies  Medication List     STOP taking these medications    polyethylene glycol 17 g packet Commonly known as: MiraLax       TAKE these medications    hydrocortisone 25 MG suppository Commonly known as: ANUSOL-HC Place 1 suppository (25 mg total) rectally 2 (two) times daily.   ibuprofen 600 MG tablet Commonly known as: ADVIL Take 1 tablet (600 mg total) by mouth every 6 (six) hours.   PRENATAL VITAMINS PO Take 1 tablet by mouth daily.         Discharge home in stable condition Infant Feeding: Breast Infant Disposition:home with mother Discharge instruction: per After Visit Summary and Postpartum booklet. Activity: Advance as tolerated. Pelvic rest for 6 weeks.  Diet: routine diet Anticipated Birth Control: Condoms and none Postpartum Appointment:4 weeks Additional Postpartum  F/U: NA Future Appointments: No future appointments.  Follow up Visit:  (Msg sent to [facility])    04/17/2021 Waldon Merl, MD  CNM attestation I have seen and examined this patient and agree with above documentation in the resident's note.   Abigail Morrow is a 25 y.o. [id number] s/p vag delivery, waterbirth.   Pain is well controlled.  Plan for birth control is no method.  Method of Feeding: breast  PE:  BP 105/63 (BP Location: Left Arm)   Pulse 61   Temp 98.6 F (37 C) (Oral)   Resp 18   Ht '5\' 9"'  (1.753 m)   LMP 07/10/2020   SpO2 97%   Breastfeeding Unknown   BMI 25.64 kg/m  Fundus firm  Recent Labs    04/16/21 0449  HGB 11.5*  HCT 33.8*     Plan: discharge today - postpartum care discussed - f/u clinic in 4 weeks for postpartum visit   Myrtis Ser, CNM 2:42 PM

## 2021-04-17 MED ORDER — IBUPROFEN 600 MG PO TABS: 600.0000 mg | ORAL_TABLET | Freq: Four times a day (QID) | ORAL | 0 refills | Status: AC

## 2021-04-30 ENCOUNTER — Telehealth (HOSPITAL_COMMUNITY): Payer: Self-pay

## 2021-04-30 NOTE — Telephone Encounter (Signed)
"  I'm doing great. I'm feeling really good." Patient has no questions or concerns  "He's good. He is eating and sleeping. We sleep together" RN provided safe sleep and SIDS prevention education. Patient has no other questions or concerns about baby.  EPDS score is 2.  Marcelino Duster [facility] 04/30/2021,1732

## 2022-06-11 IMAGING — US US MFM OB FOLLOW-UP
1 series · 13 of 28 positions shown · non-contrast
Comparison: none

[Series 1: us mfm ob follow-up · 13 of 61 slices shown]
[im 3/61]
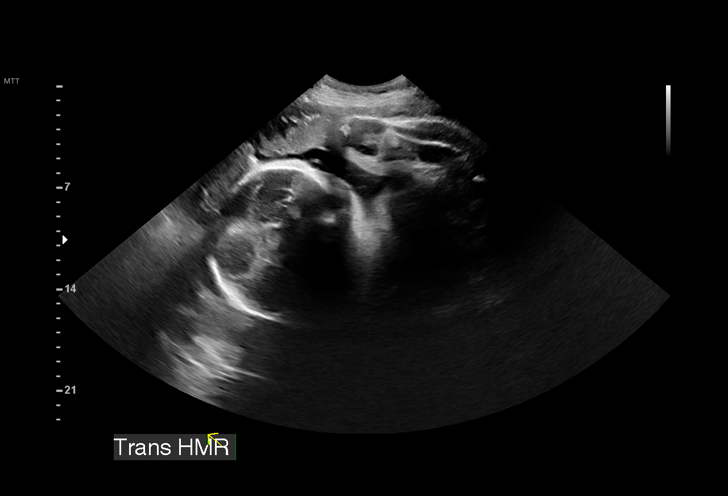
[im 7/61]
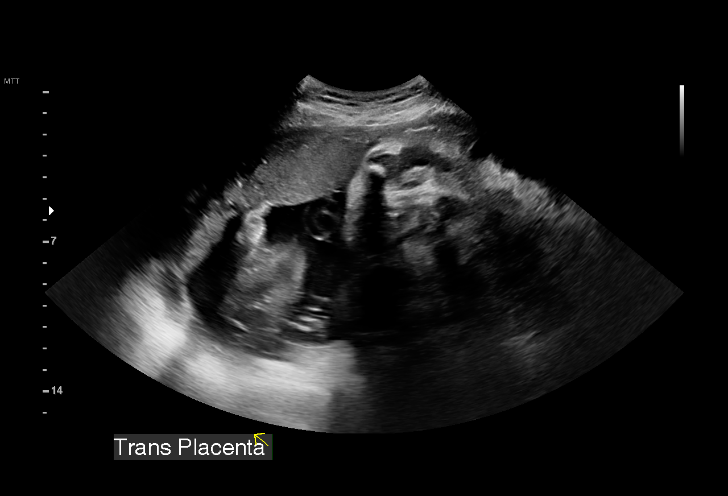
[im 12/61]
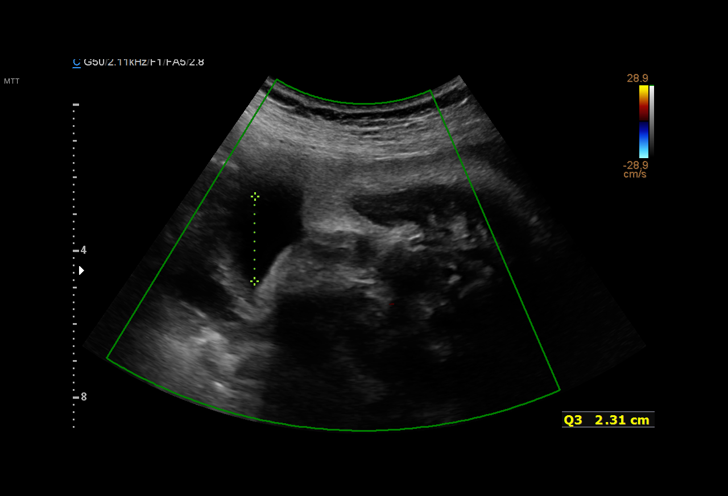
[im 16/61]
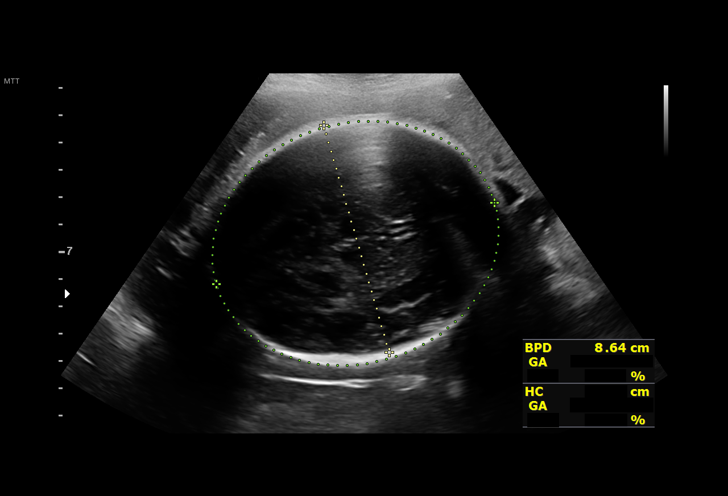
[im 21/61]
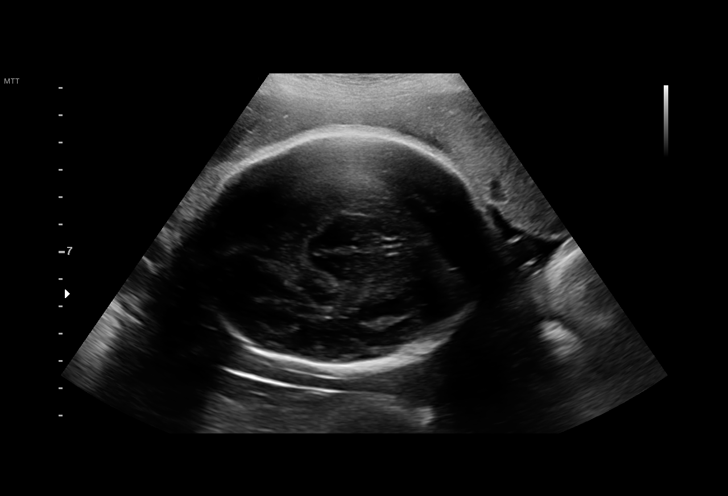
[im 25/61]
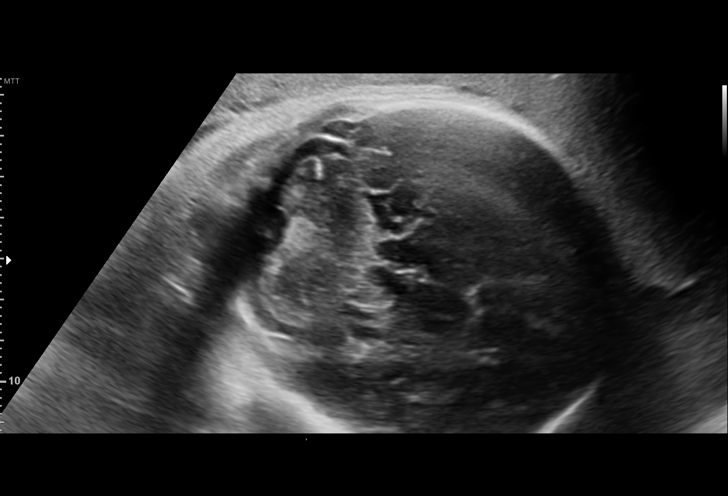
[im 32/61]
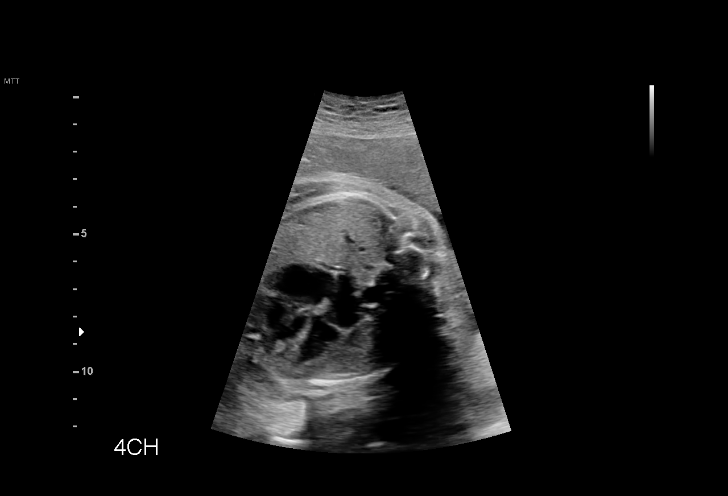
[im 36/61]
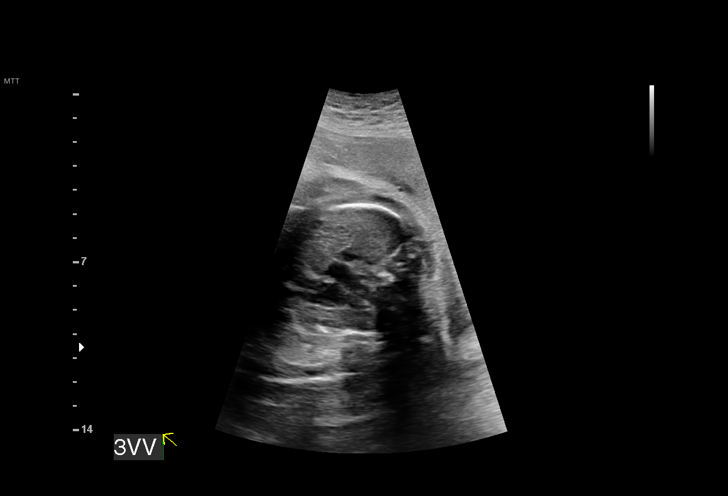
[im 41/61]
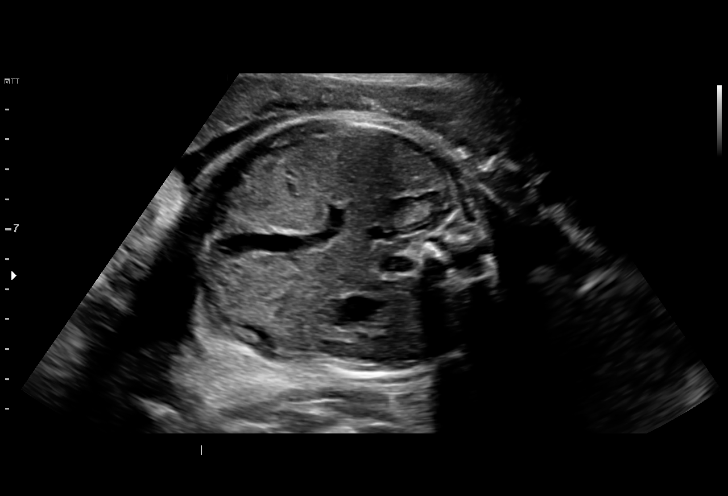
[im 45/61]
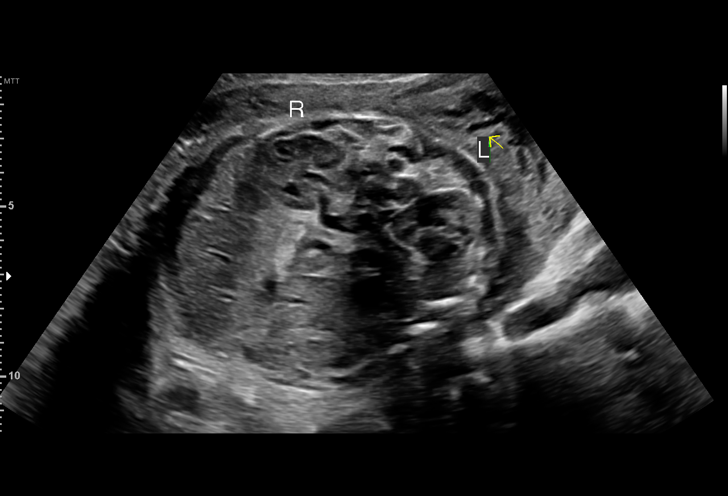
[im 49/61]
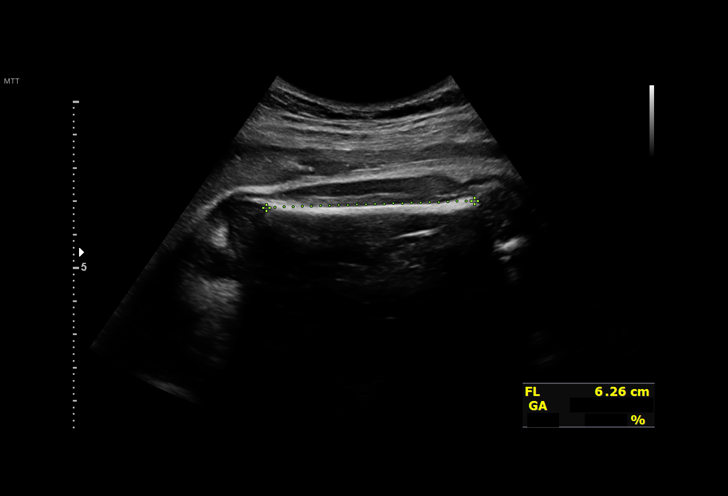
[im 54/61]
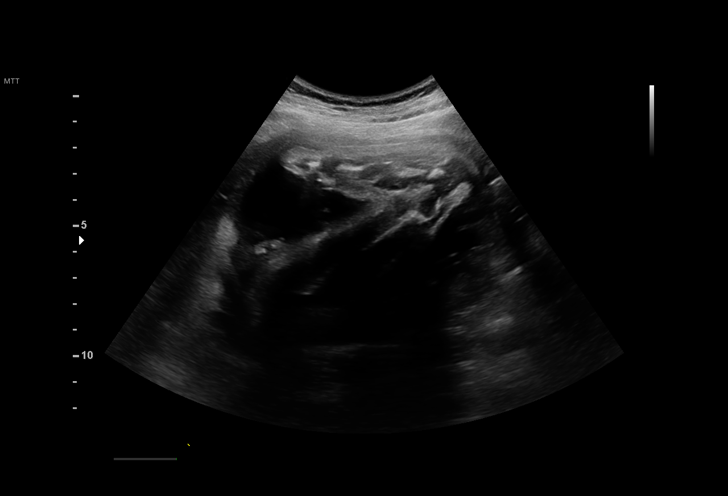
[im 58/61]
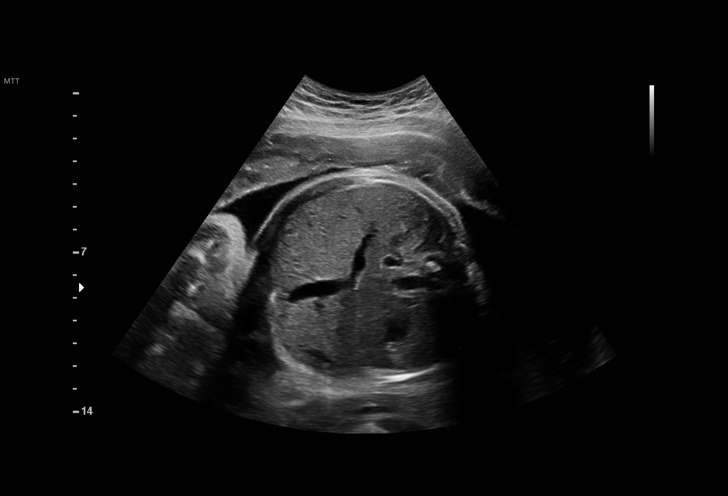

[13 of 28 positions shown; findings below may reference images not displayed]

Indications

 33 weeks gestation of pregnancy
 Fetal abnormality - other known or suspected
 Low risk NIPS( Negative  AFP)(Negative
 Horizon [DATE])
 Encounter for other antenatal screening
 follow-up
Fetal Evaluation

 Num Of Fetuses:         1
 Fetal Heart Rate(bpm):  132
 Cardiac Activity:       Observed
 Presentation:           Transverse, head to maternal right
 Placenta:               Anterior
 P. Cord Insertion:      Previously Visualized

 Amniotic Fluid
 AFI FV:      Within normal limits

 AFI Sum(cm)     %Tile       Largest Pocket(cm)
 18.8            70

 RUQ(cm)       RLQ(cm)       LUQ(cm)        LLQ(cm)
 3
Biometry
 BPD:      85.3  mm     G. Age:  34w 3d         79  %    CI:        77.47   %    70 - 86
                                                         FL/HC:      20.2   %    19.9 -
 HC:      306.8  mm     G. Age:  34w 1d         39  %    HC/AC:      0.97        0.96 -
 AC:      316.2  mm     G. Age:  35w 4d         97  %    FL/BPD:     72.6   %    71 - 87
 FL:       61.9  mm     G. Age:  32w 1d         15  %    FL/AC:      19.6   %    20 - 24
 CER:      43.7  mm     G. Age:  34w 2d         53  %
 LV:        5.2  mm
 CM:         11  mm

 Est. FW:    9444  gm      5 lb 5 oz     79  %
OB History

 Gravidity:    2         Term:   1        Prem:   0        SAB:   0
 TOP:          0       Ectopic:  0        Living: 1
Gestational Age

 LMP:           33w 1d        Date:  07/10/20                 EDD:   04/16/21
 U/S Today:     34w 1d                                        EDD:   04/09/21
 Best:          33w 1d     Det. By:  LMP  (07/10/20)          EDD:   04/16/21
Anatomy

 Cranium:               Appears normal         LVOT:                   Appears normal
 Cavum:                 Appears normal         Aortic Arch:            Previously seen
 Ventricles:            Appears normal         Ductal Arch:            Previously seen
 Choroid Plexus:        Previously seen        Diaphragm:              Previously seen
 Cerebellum:            Appears normal         Stomach:                Previously Seen
 Posterior Fossa:       Enlarged cisternal     Abdomen:                Previously seen
                        magna, 1.2mm
 Nuchal Fold:           Previously seen        Abdominal Wall:         Previously seen
 Face:                  Orbits and profile     Cord Vessels:           Previously seen
                        previously seen
 Lips:                  Previously seen        Kidneys:                Appear normal
 Palate:                Previously seen        Bladder:                Appears normal
 Thoracic:              Appears normal         Spine:                  Previously seen
 Heart:                 Appears normal         Upper Extremities:      Previously seen
                        (4CH, axis, and
                        situs)
 RVOT:                  Appears normal         Lower Extremities:      Previously seen

 Other:  Heels/feet, open hands/5th digits, Nasal bone, Lenses, VC, 3VV, and
         3VTV visualized previously. Male gender previously seen.
Cervix Uterus Adnexa

 Cervix
 Length:           3.54  cm.
 Normal appearance by transabdominal scan.

 Uterus
 No abnormality visualized.

 Right Ovary
 Within normal limits.
 Left Ovary
 Within normal limits.

 Cul De Sac
 No free fluid seen.

 Adnexa
 No abnormality visualized.
Impression

 Follow up growth due to mega cysterna magna
 Normal interval growth with measurements consistent with
 dates
 Good fetal movement and amniotic fluid volume

 I discussed with Ms. Claus Mathias that her today's cysterna
 magna again appears unchanged with range of
 measurements 0.8 to 1.1 cm. Again there are no markers for
 aneuploidy.
 She had a low risk NIPS.

 I reviewed today's findings and reassured her that in most
 cases this is likely a normal variant however, should further
 testing be desired I discussed postnatal evaluation via
 microarray and consider pediatric genetic consultation.
Recommendations

 Follow up growth was scheduled in 4 weeks.

## 2022-07-09 IMAGING — US US MFM OB FOLLOW-UP
1 series · 12 of 28 positions shown · non-contrast
Comparison: none

[Series 1: us mfm ob follow-up · 12 of 34 slices shown]
[im 2/34]
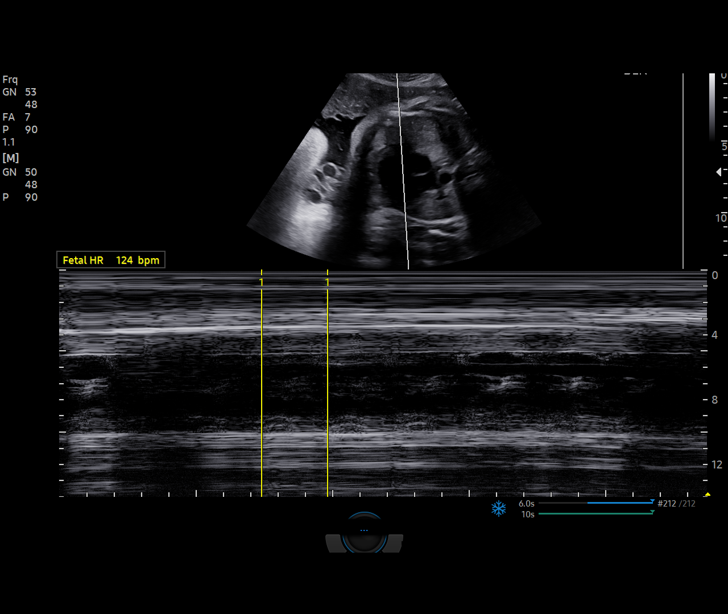
[im 4/34]
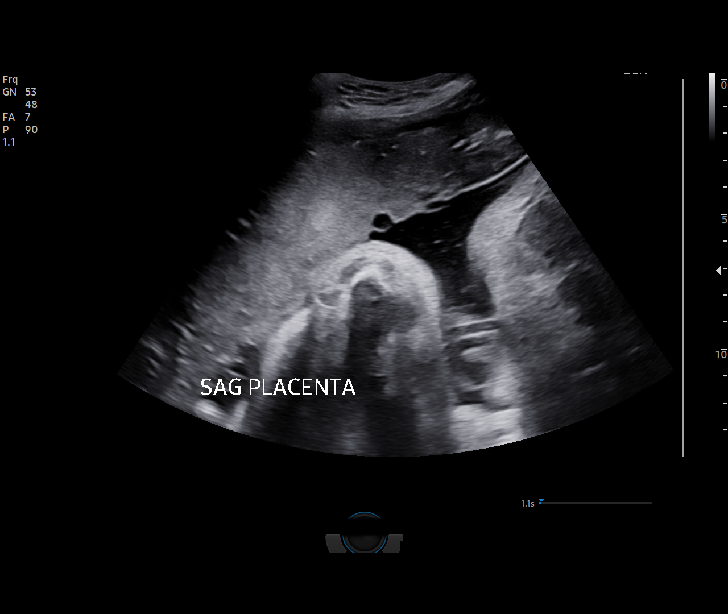
[im 7/34]
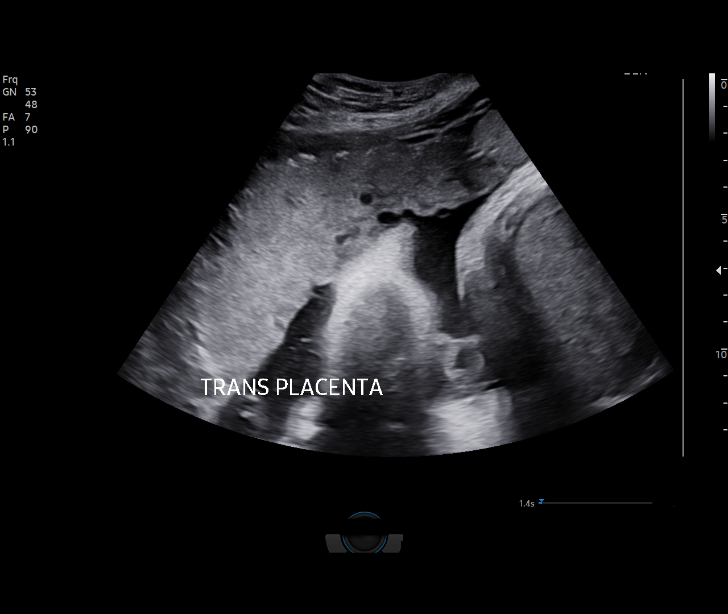
[im 10/34]
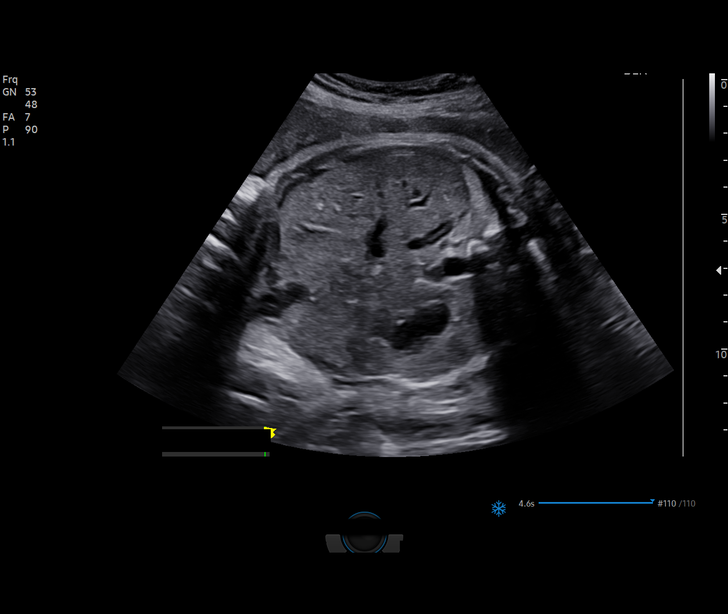
[im 13/34]
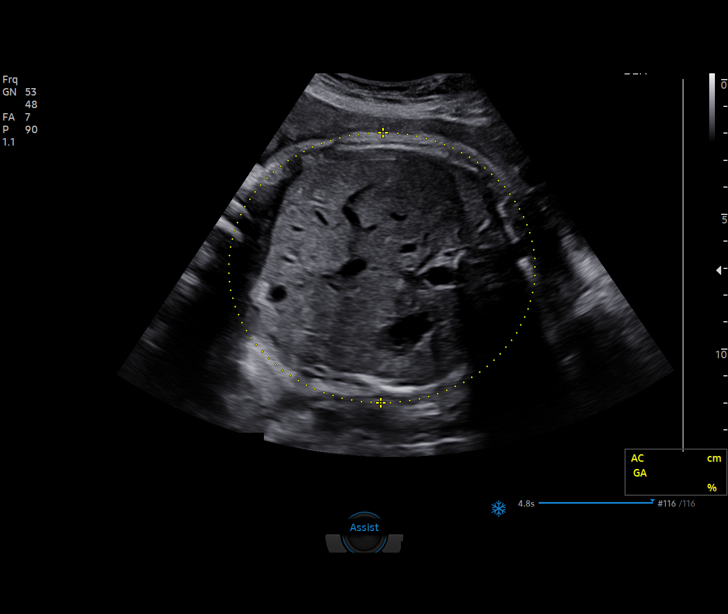
[im 15/34]
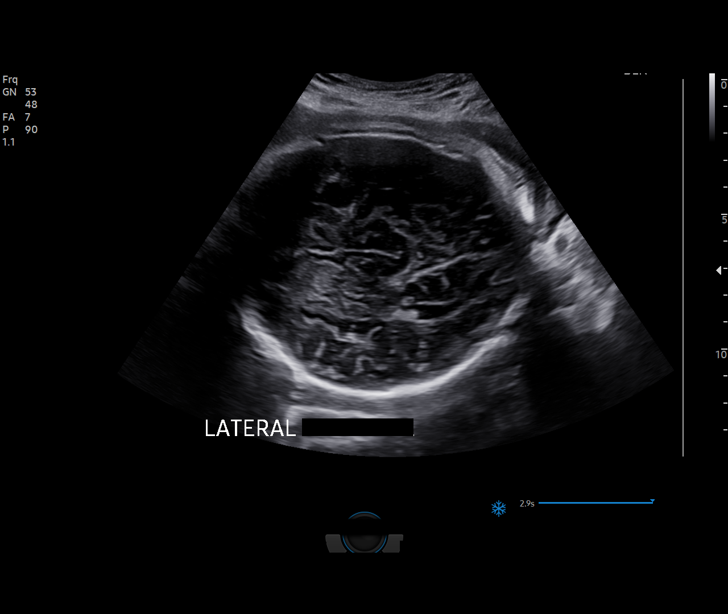
[im 19/34]
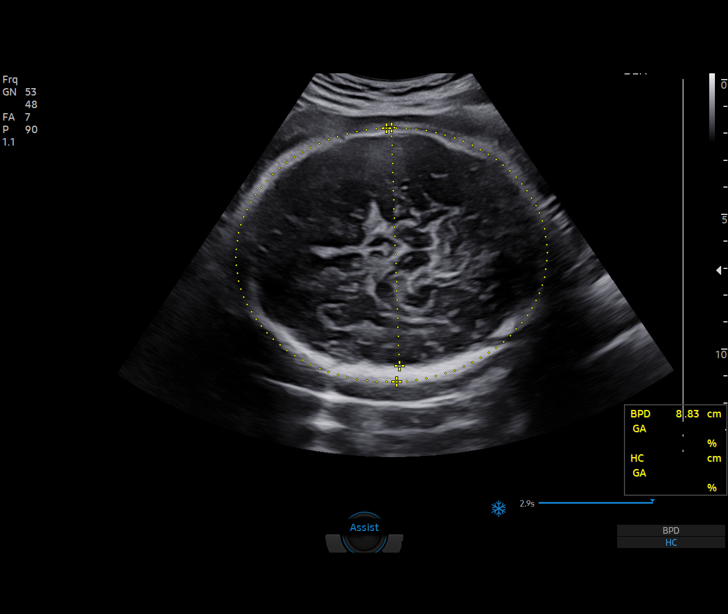
[im 21/34]
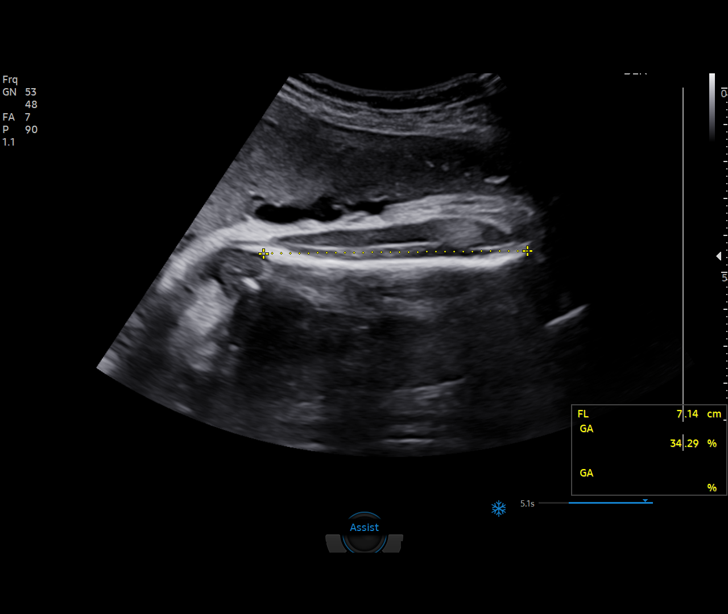
[im 24/34]
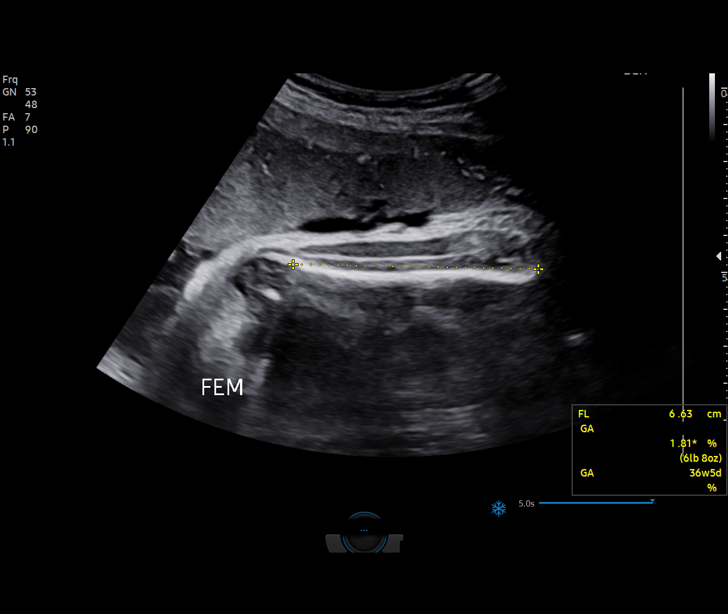
[im 27/34]
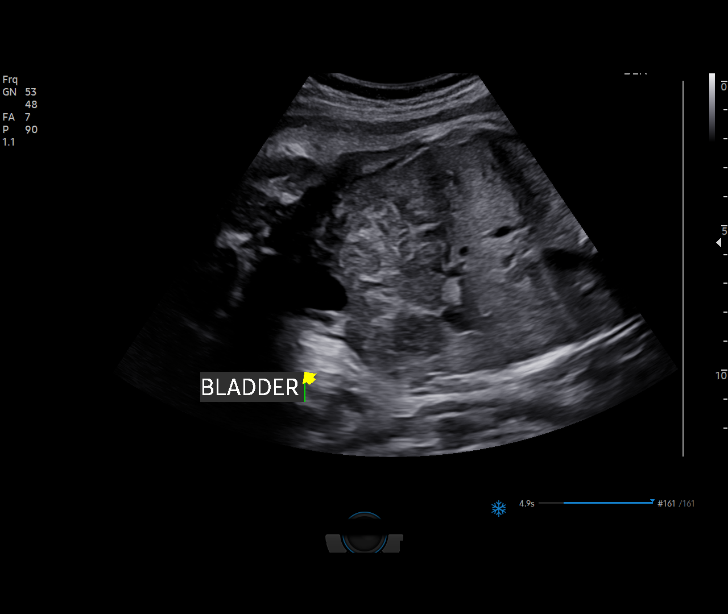
[im 30/34]
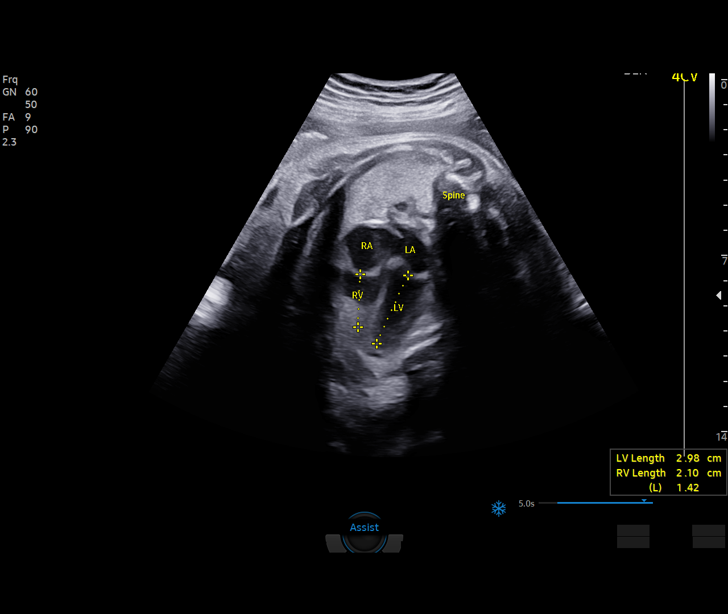
[im 32/34]
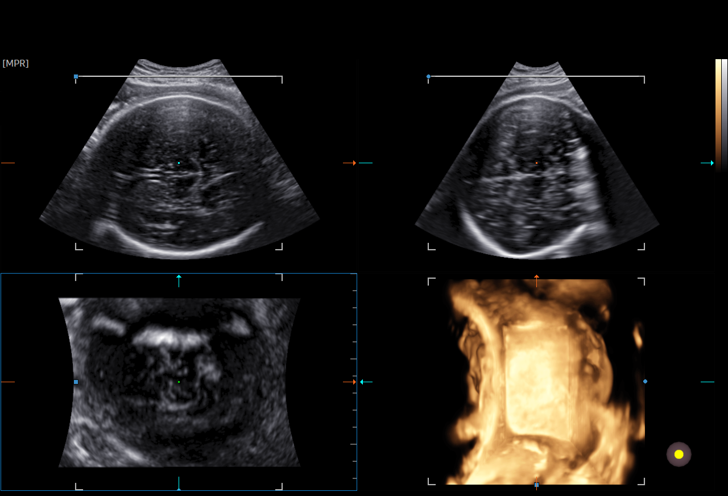

[12 of 28 positions shown; findings below may reference images not displayed]

SLEEM

Indications

 37 weeks gestation of pregnancy
 Fetal abnormality - other known or suspected
 Low risk NIPS( Negative  AFP)(Negative
 Horizon [DATE])
 Encounter for other antenatal screening
 follow-up
Fetal Evaluation

 Num Of Fetuses:         1
 Preg. Location:         Intrauterine
 Fetal Heart Rate(bpm):  124
 Cardiac Activity:       Observed
 Presentation:           Cephalic
 Placenta:               Anterior

 Amniotic Fluid
 AFI FV:      Within normal limits

 AFI Sum(cm)     %Tile       Largest Pocket(cm)
 10.44           27

 RUQ(cm)       RLQ(cm)       LUQ(cm)        LLQ(cm)

Biometry
 BPD:      91.2  mm     G. Age:  37w 0d         63  %    CI:        77.06   %    70 - 86
                                                         FL/HC:      21.7   %    20.8 -
 HC:       329   mm     G. Age:  37w 3d         31  %    HC/AC:      0.99        0.92 -
 AC:      333.5  mm     G. Age:  37w 2d         68  %    FL/BPD:     78.3   %    71 - 87
 FL:       71.4  mm     G. Age:  36w 4d         35  %    FL/AC:      21.4   %    20 - 24
 HUM:      61.3  mm     G. Age:  35w 3d         41  %
 LV:        6.1  mm
 CM:        4.7  mm

 Est. FW:    3000  gm    6 lb 14 oz      55  %
OB History

 Gravidity:    2         Term:   1        Prem:   0        SAB:   0
 TOP:          0       Ectopic:  0        Living: 1
Gestational Age

 LMP:           37w 1d        Date:  07/10/20                 EDD:   04/16/21
 U/S Today:     37w 1d                                        EDD:   04/16/21
 Best:          37w 1d     Det. By:  LMP  (07/10/20)          EDD:   04/16/21
Anatomy

 Cranium:               Appears normal         Cord Vessels:           Appears normal (3
                                                                       vessel cord)
 Cavum:                 Appears normal         Kidneys:                Appear normal
 Ventricles:            Appears normal         Bladder:                Appears normal
 Face:                  appears normal         Upper Extremities:      Visualized
 Heart:                 Limited Views          Lower Extremities:      Visualized
 Stomach:               Appears normal, left
                        sided
Cervix Uterus Adnexa

 Cervix
 Not visualized (advanced GA >38wks)

 Uterus
 No abnormality visualized.

 Right Ovary
 Not visualized.

 Left Ovary
 Not visualized.

 Cul De Sac
 No free fluid seen.

 Adnexa
 No abnormality visualized.
Impression

 Enlarged cisterna magna was seen on previous ultrasound.

 Amniotic fluid is normal good fetal activity seen.Fetal growth
 is appropriate for gestational age .  The cisterna magna
 appears normal.  Posterior fossa appears normal.
 We reassured the patient of the findings.
Recommendations

 Follow-up scans as clinically indicated.
                 Olynn, [place]

## 2023-11-18 ENCOUNTER — Encounter (HOSPITAL_BASED_OUTPATIENT_CLINIC_OR_DEPARTMENT_OTHER): Payer: Self-pay

## 2023-11-18 ENCOUNTER — Other Ambulatory Visit: Payer: Self-pay

## 2023-11-18 DIAGNOSIS — W25XXXA Contact with sharp glass, initial encounter: Secondary | ICD-10-CM | POA: Insufficient documentation

## 2023-11-18 DIAGNOSIS — S70922A Unspecified superficial injury of left thigh, initial encounter: Secondary | ICD-10-CM | POA: Diagnosis present

## 2023-11-18 DIAGNOSIS — Y990 Civilian activity done for income or pay: Secondary | ICD-10-CM | POA: Insufficient documentation

## 2023-11-18 DIAGNOSIS — Z5321 Procedure and treatment not carried out due to patient leaving prior to being seen by health care provider: Secondary | ICD-10-CM | POA: Diagnosis not present

## 2023-11-18 DIAGNOSIS — S71112A Laceration without foreign body, left thigh, initial encounter: Secondary | ICD-10-CM | POA: Diagnosis not present

## 2023-11-18 NOTE — ED Triage Notes (Signed)
 Pt reports she is here today due to injury at work. 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 Pt has small lac to left thigh. Pt reports bleeding stopped before arrival. Pt wound cleaned in triage and bandage. Pt reports she was able to get the piece of glass from her thigh. Pt reports it was a very small amount of glass. No glass was noted to be in the wound in triage.

## 2023-11-19 ENCOUNTER — Emergency Department (HOSPITAL_BASED_OUTPATIENT_CLINIC_OR_DEPARTMENT_OTHER)
Admission: EM | Admit: 2023-11-19 | Discharge: 2023-11-19 | Discharge status: Against Medical Advice | Payer: Medicaid Other | Attending: Emergency Medicine | Admitting: Emergency Medicine

## 2023-11-19 NOTE — ED Notes (Signed)
 Multiple calls for room placement. Eloped from post triage waiting.

## 2023-12-10 ENCOUNTER — Encounter (HOSPITAL_COMMUNITY): Payer: Self-pay | Admitting: *Deleted

## 2023-12-10 ENCOUNTER — Ambulatory Visit (HOSPITAL_COMMUNITY): Admission: EM | Admit: 2023-12-10 | Discharge: 2023-12-10 | Disposition: A | Discharge status: Home / Self Care

## 2023-12-10 DIAGNOSIS — Z4802 Encounter for removal of sutures: Secondary | ICD-10-CM | POA: Diagnosis not present

## 2023-12-10 NOTE — Discharge Instructions (Addendum)
 Suture removal -3 sutures and left upper leg removed without difficulty.  No secondary sign of infection, no swelling, erythema, drainage, warmth, pain with palpation. -No further treatment or follow-up necessary at this time.

## 2023-12-10 NOTE — ED Provider Notes (Signed)
  [facility]  Note:  This document was prepared using Dragon voice recognition software and may include unintentional dictation errors.  MRN: [id number] DOB: 07-Jan-1996  Subjective:   Abigail Morrow is a 28 y.o. female presenting for removal of 3 sutures from left upper leg following suture placement approximately 21 days ago.  Patient denies any complications.  No secondary signs of infection.  No current facility-administered medications for this encounter.  Current Outpatient Medications:    hydrocortisone (ANUSOL-HC) 25 MG suppository, Place 1 suppository (25 mg total) rectally 2 (two) times daily., Disp: 12 suppository, Rfl: 1   ibuprofen (ADVIL) 600 MG tablet, Take 1 tablet (600 mg total) by mouth every 6 (six) hours., Disp: 30 tablet, Rfl: 0   Prenatal Vit-Fe Fumarate-FA (PRENATAL VITAMINS PO), Take 1 tablet by mouth daily., Disp: , Rfl:    No Known Allergies  Past Medical History:  Diagnosis Date   Migraine headache with aura    Palpitations    UTI (urinary tract infection)      Past Surgical History:  Procedure Laterality Date   WISDOM TOOTH EXTRACTION  2015    History reviewed. No pertinent family history.  Social History   Tobacco Use   Smoking status: Never   Smokeless tobacco: Never  Vaping Use   Vaping status: Never Used  Substance Use Topics   Alcohol use: Not Currently    Comment: occasionally   Drug use: Never    ROS Refer to HPI for ROS details.  Objective:   Vitals: BP 118/78 (BP Location: Right Arm)   Pulse 72   Temp 99.2 F (37.3 C) (Oral)   Resp 16   LMP 11/26/2023 (Approximate)   SpO2 98%   Physical Exam Vitals and nursing note reviewed.  Constitutional:      General: She is not in acute distress.    Appearance: She is well-developed. She is not ill-appearing or toxic-appearing.  Cardiovascular:     Rate and Rhythm: Normal rate.  Pulmonary:     Effort: Pulmonary effort is normal. No respiratory distress.  Skin:     General: Skin is warm and dry.     Comments: 3 sutures removed from left upper leg, wound well-approximated, fully healed, no secondary signs of infection no redness, warmth, drainage, pain with palpation.  Neurological:     General: No focal deficit present.     Mental Status: She is alert and oriented to person, place, and time.  Psychiatric:        Mood and Affect: Mood normal.     Procedures  No results found for this or any previous visit (from the past 24 hours).  Assessment and Plan :   PDMP not reviewed this encounter.  1. Visit for suture removal    Suture removal -3 sutures and left upper leg removed without difficulty.  No secondary sign of infection, no swelling, erythema, drainage, warmth, pain with palpation. -No further treatment or follow-up necessary at this time.  Lucky Cowboy   [place], [place] B, [place] 12/10/23 1827

## 2023-12-10 NOTE — ED Triage Notes (Signed)
 Pt states she has three sutures in her upper left thigh placed 21 days ago and she needs then removed. She states she had a lot happen so she hasn't ben able to have them taken out. The area is a little red. She did not finish the antibiotics she was given only took about 3 days.
